# Patient Record
Sex: Female | Born: 1935 | Race: Black or African American | Hispanic: No | Marital: Married | Smoking: Never smoker
Health system: Southern US, Community
[De-identification: ages and names within clinical notes are randomized; demographics above are authoritative.]

## PROBLEM LIST (undated history)

## (undated) DIAGNOSIS — N189 Chronic kidney disease, unspecified: Secondary | ICD-10-CM

## (undated) DIAGNOSIS — K56609 Unspecified intestinal obstruction, unspecified as to partial versus complete obstruction: Secondary | ICD-10-CM

## (undated) DIAGNOSIS — C189 Malignant neoplasm of colon, unspecified: Secondary | ICD-10-CM

## (undated) DIAGNOSIS — I10 Essential (primary) hypertension: Secondary | ICD-10-CM

## (undated) DIAGNOSIS — K635 Polyp of colon: Secondary | ICD-10-CM

## (undated) HISTORY — DX: Unspecified intestinal obstruction, unspecified as to partial versus complete obstruction: K56.609

## (undated) HISTORY — DX: Chronic kidney disease, unspecified: N18.9

## (undated) HISTORY — DX: Polyp of colon: K63.5

---

## 1999-04-14 ENCOUNTER — Other Ambulatory Visit: Admission: RE | Admit: 1999-04-14 | Discharge: 1999-04-14 | Payer: Self-pay | Admitting: Family Medicine

## 1999-11-20 ENCOUNTER — Ambulatory Visit (HOSPITAL_COMMUNITY): Admission: RE | Admit: 1999-11-20 | Discharge: 1999-11-20 | Payer: Self-pay | Admitting: Family Medicine

## 1999-11-20 ENCOUNTER — Encounter: Payer: Self-pay | Admitting: Family Medicine

## 2001-01-11 ENCOUNTER — Encounter: Admission: RE | Admit: 2001-01-11 | Discharge: 2001-01-11 | Payer: Self-pay | Admitting: Family Medicine

## 2001-01-11 ENCOUNTER — Encounter: Payer: Self-pay | Admitting: Family Medicine

## 2002-01-11 ENCOUNTER — Encounter: Admission: RE | Admit: 2002-01-11 | Discharge: 2002-01-11 | Payer: Self-pay | Admitting: Family Medicine

## 2002-01-11 ENCOUNTER — Encounter: Payer: Self-pay | Admitting: Family Medicine

## 2002-08-03 ENCOUNTER — Other Ambulatory Visit: Admission: RE | Admit: 2002-08-03 | Discharge: 2002-08-03 | Payer: Self-pay | Admitting: Family Medicine

## 2003-05-19 DIAGNOSIS — C189 Malignant neoplasm of colon, unspecified: Secondary | ICD-10-CM

## 2003-05-19 HISTORY — DX: Malignant neoplasm of colon, unspecified: C18.9

## 2003-05-19 HISTORY — PX: COLON SURGERY: SHX602

## 2004-03-05 ENCOUNTER — Ambulatory Visit (HOSPITAL_COMMUNITY): Admission: RE | Admit: 2004-03-05 | Discharge: 2004-03-05 | Payer: Self-pay | Admitting: Gastroenterology

## 2004-03-05 ENCOUNTER — Encounter (INDEPENDENT_AMBULATORY_CARE_PROVIDER_SITE_OTHER): Payer: Self-pay | Admitting: *Deleted

## 2004-03-12 ENCOUNTER — Encounter (INDEPENDENT_AMBULATORY_CARE_PROVIDER_SITE_OTHER): Payer: Self-pay | Admitting: *Deleted

## 2004-03-12 ENCOUNTER — Inpatient Hospital Stay (HOSPITAL_COMMUNITY): Admission: RE | Admit: 2004-03-12 | Discharge: 2004-03-18 | Payer: Self-pay | Admitting: General Surgery

## 2004-03-12 HISTORY — PX: OTHER SURGICAL HISTORY: SHX169

## 2004-04-04 ENCOUNTER — Ambulatory Visit: Payer: Self-pay | Admitting: Oncology

## 2004-04-18 ENCOUNTER — Ambulatory Visit (HOSPITAL_BASED_OUTPATIENT_CLINIC_OR_DEPARTMENT_OTHER): Admission: RE | Admit: 2004-04-18 | Discharge: 2004-04-18 | Payer: Self-pay | Admitting: General Surgery

## 2004-04-18 ENCOUNTER — Ambulatory Visit (HOSPITAL_COMMUNITY): Admission: RE | Admit: 2004-04-18 | Discharge: 2004-04-18 | Payer: Self-pay | Admitting: General Surgery

## 2004-05-18 DIAGNOSIS — K56609 Unspecified intestinal obstruction, unspecified as to partial versus complete obstruction: Secondary | ICD-10-CM

## 2004-05-18 HISTORY — DX: Unspecified intestinal obstruction, unspecified as to partial versus complete obstruction: K56.609

## 2004-05-20 ENCOUNTER — Ambulatory Visit: Payer: Self-pay | Admitting: Oncology

## 2004-07-08 ENCOUNTER — Ambulatory Visit: Payer: Self-pay | Admitting: Oncology

## 2004-08-25 ENCOUNTER — Ambulatory Visit: Payer: Self-pay | Admitting: Oncology

## 2004-10-24 ENCOUNTER — Ambulatory Visit: Payer: Self-pay | Admitting: Oncology

## 2004-11-02 ENCOUNTER — Inpatient Hospital Stay (HOSPITAL_COMMUNITY): Admission: EM | Admit: 2004-11-02 | Discharge: 2004-11-14 | Payer: Self-pay | Admitting: Emergency Medicine

## 2004-11-03 ENCOUNTER — Ambulatory Visit: Payer: Self-pay | Admitting: Oncology

## 2004-12-18 ENCOUNTER — Ambulatory Visit: Payer: Self-pay | Admitting: Oncology

## 2004-12-20 IMAGING — CR DG CHEST 1V PORT
2 series · 2 of 2 positions shown · non-contrast
Comparison: none

CLINICAL DATA: 68-year-old ? colon cancer.  Pneumothorax. 
 PORTABLE CHEST SINGLE VIEW 04/18/04:
 Single portable view of the chest compared to the previous study from earlier today.  There are inspiration and expiration views.  The left sided port-a-cath is stable.  No definite pneumothorax is seen.  Persistent bibasilar atelectasis.  No edema or effusions.

[view not recorded (1 of 2)]
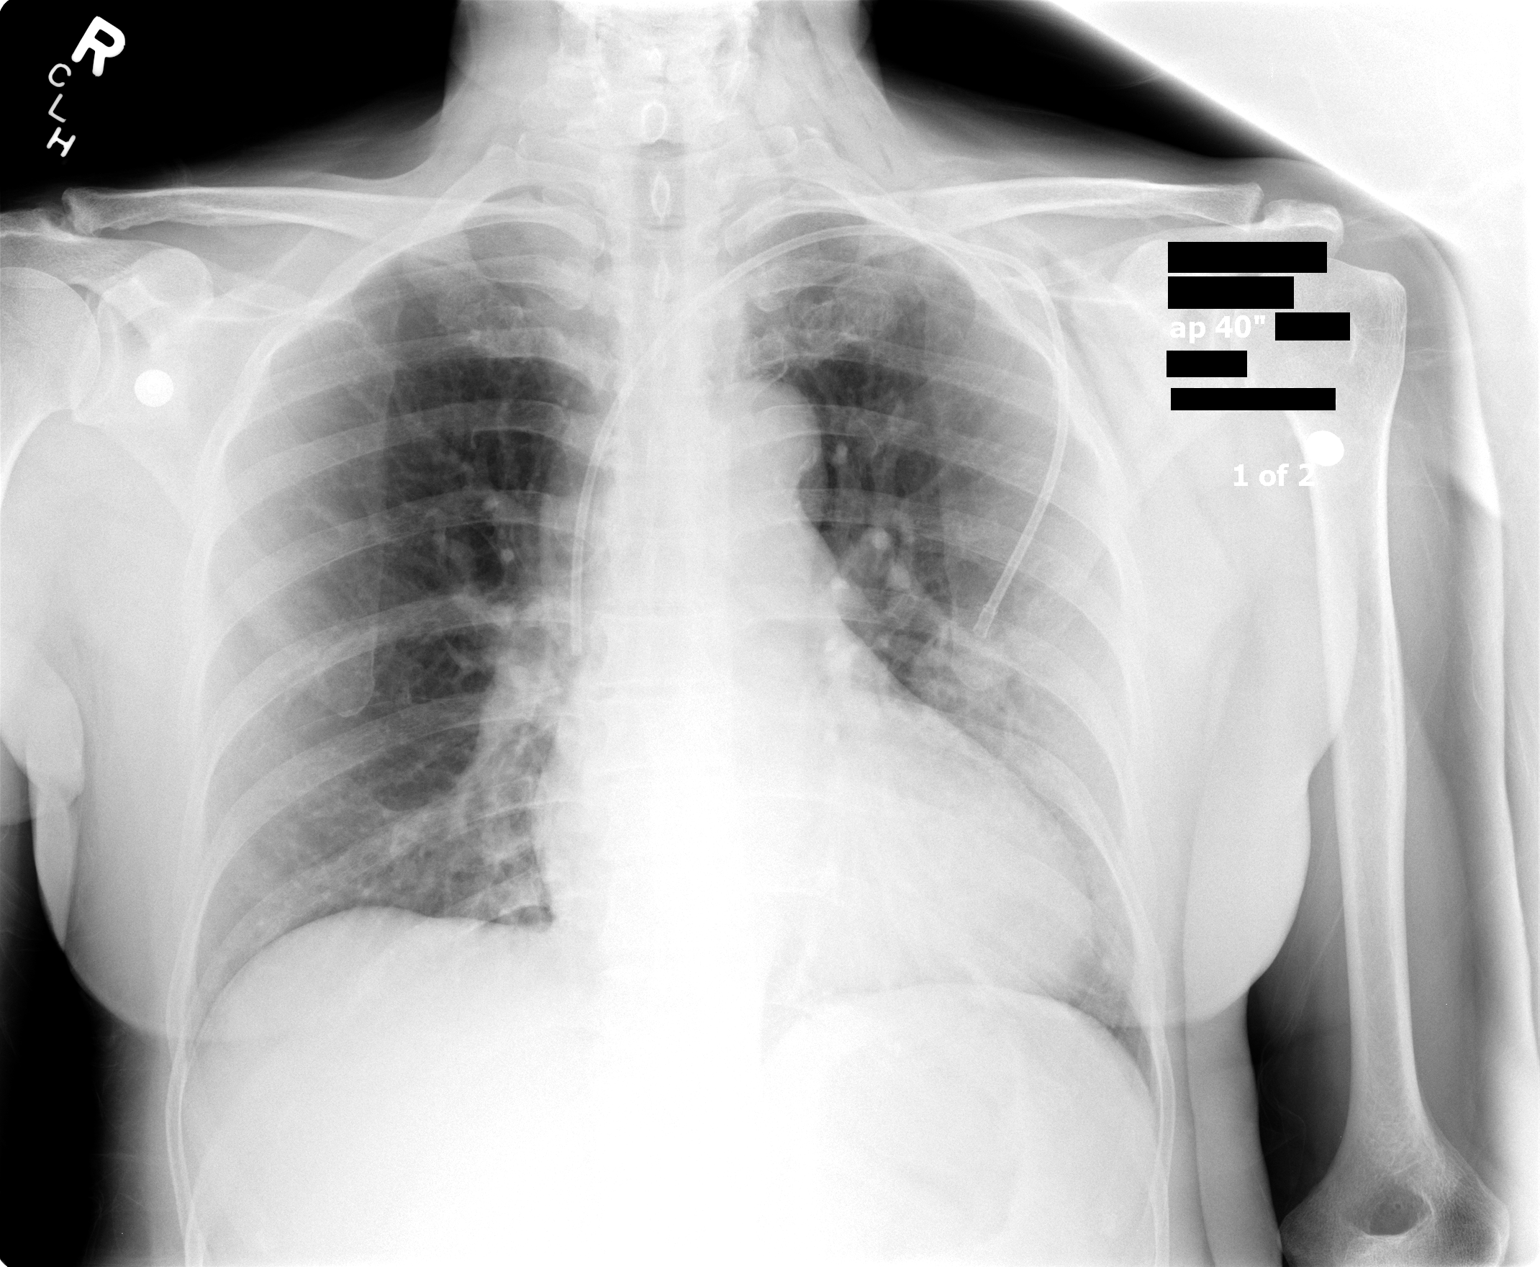

[view not recorded (2 of 2)]
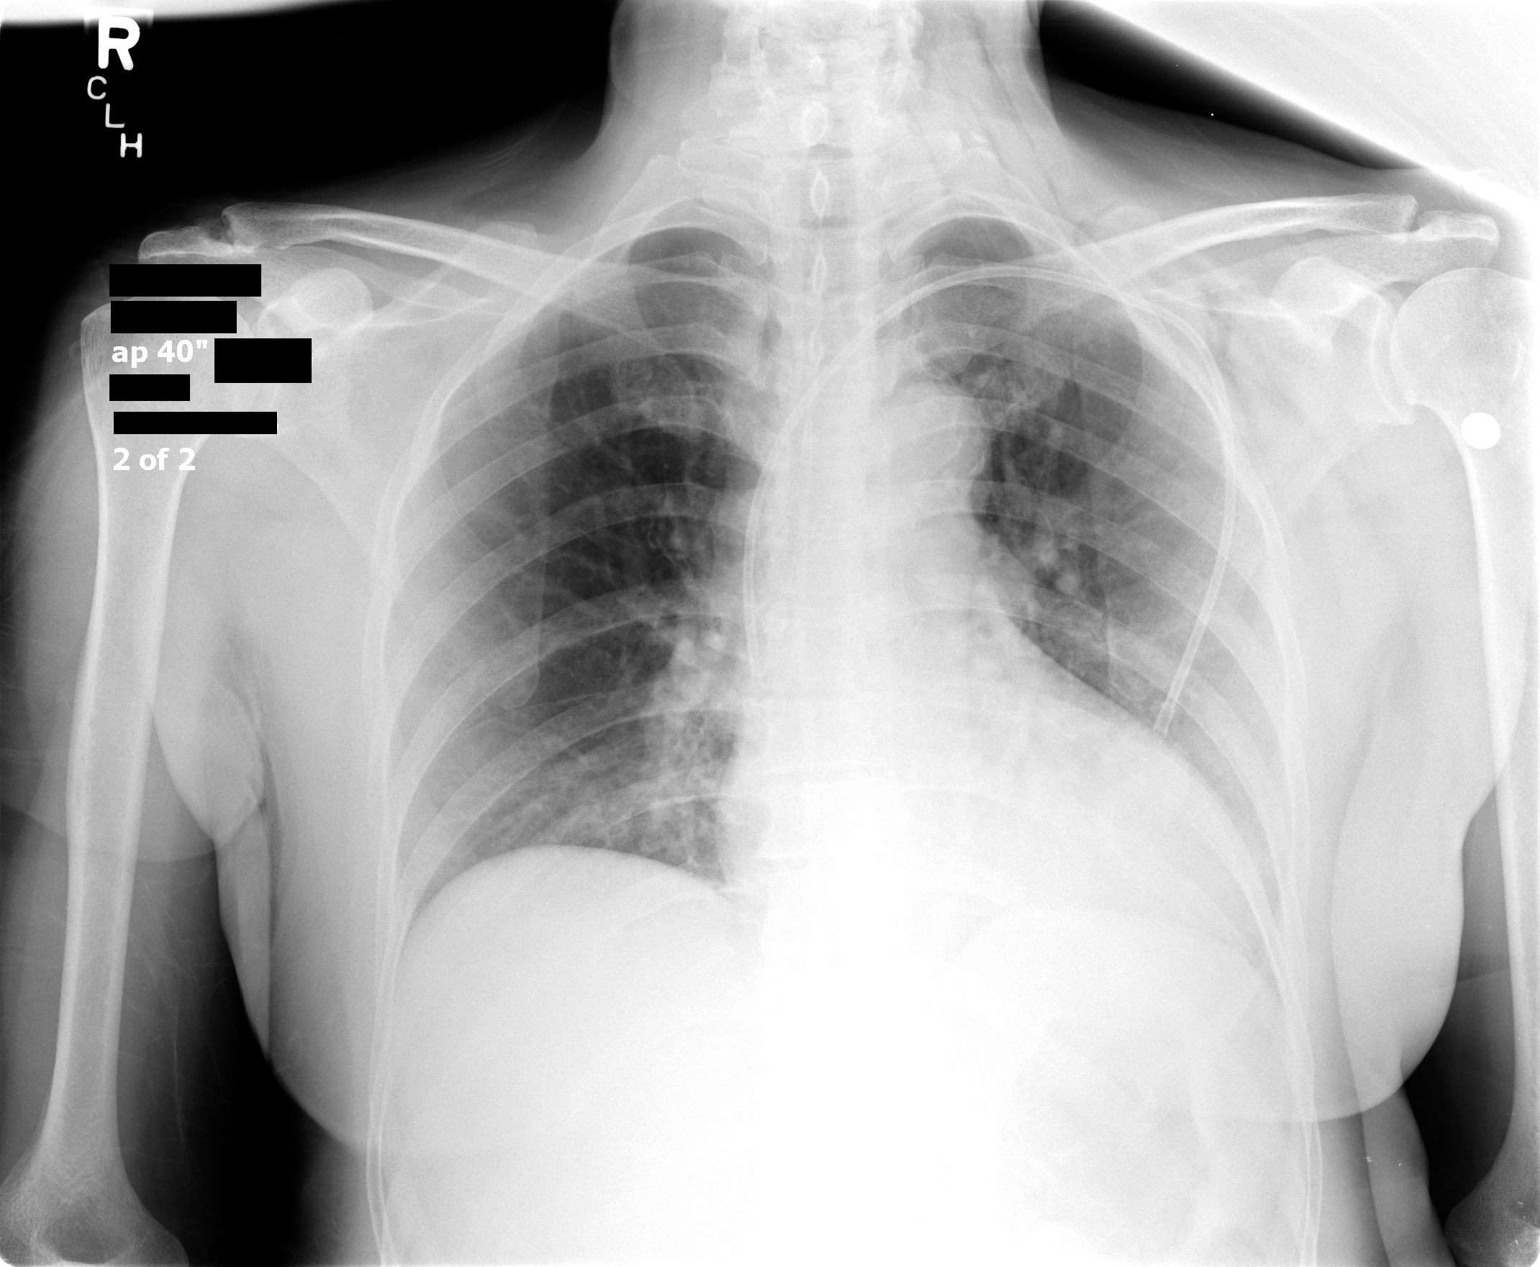

[2 of 2 positions shown; findings below may reference images not displayed]

IMPRESSION: 1.  Inspiration and expiration chest films demonstrating no definite left sided pneumothorax.  There is a stable small amount of subcutaneous emphysema in the left chest and neck.

## 2005-03-18 ENCOUNTER — Ambulatory Visit: Payer: Self-pay | Admitting: Oncology

## 2005-04-20 ENCOUNTER — Encounter (INDEPENDENT_AMBULATORY_CARE_PROVIDER_SITE_OTHER): Payer: Self-pay | Admitting: *Deleted

## 2005-04-20 ENCOUNTER — Ambulatory Visit (HOSPITAL_COMMUNITY): Admission: RE | Admit: 2005-04-20 | Discharge: 2005-04-20 | Payer: Self-pay | Admitting: Gastroenterology

## 2005-04-27 ENCOUNTER — Ambulatory Visit (HOSPITAL_COMMUNITY): Admission: RE | Admit: 2005-04-27 | Discharge: 2005-04-27 | Payer: Self-pay | Admitting: Oncology

## 2005-06-12 ENCOUNTER — Ambulatory Visit (HOSPITAL_BASED_OUTPATIENT_CLINIC_OR_DEPARTMENT_OTHER): Admission: RE | Admit: 2005-06-12 | Discharge: 2005-06-12 | Payer: Self-pay | Admitting: General Surgery

## 2005-10-02 ENCOUNTER — Ambulatory Visit: Payer: Self-pay | Admitting: Oncology

## 2005-10-06 ENCOUNTER — Ambulatory Visit (HOSPITAL_COMMUNITY): Admission: RE | Admit: 2005-10-06 | Discharge: 2005-10-06 | Payer: Self-pay | Admitting: Oncology

## 2005-10-06 LAB — COMPREHENSIVE METABOLIC PANEL
AST: 19 U/L (ref 0–37)
Alkaline Phosphatase: 94 U/L (ref 39–117)
Glucose, Bld: 148 mg/dL — ABNORMAL HIGH (ref 70–99)
Sodium: 139 mEq/L (ref 135–145)
Total Bilirubin: 0.6 mg/dL (ref 0.3–1.2)
Total Protein: 7.3 g/dL (ref 6.0–8.3)

## 2005-10-09 LAB — BASIC METABOLIC PANEL
BUN: 33 mg/dL — ABNORMAL HIGH (ref 6–23)
CO2: 29 mEq/L (ref 19–32)
Calcium: 9.8 mg/dL (ref 8.4–10.5)
Glucose, Bld: 104 mg/dL — ABNORMAL HIGH (ref 70–99)
Potassium: 4.3 mEq/L (ref 3.5–5.3)
Sodium: 137 mEq/L (ref 135–145)

## 2005-10-22 LAB — BASIC METABOLIC PANEL
BUN: 22 mg/dL (ref 6–23)
Creatinine, Ser: 1.29 mg/dL — ABNORMAL HIGH (ref 0.40–1.20)
Glucose, Bld: 102 mg/dL — ABNORMAL HIGH (ref 70–99)
Potassium: 5 mEq/L (ref 3.5–5.3)

## 2006-04-09 ENCOUNTER — Ambulatory Visit: Payer: Self-pay | Admitting: Oncology

## 2006-04-13 ENCOUNTER — Ambulatory Visit (HOSPITAL_COMMUNITY): Admission: RE | Admit: 2006-04-13 | Discharge: 2006-04-13 | Payer: Self-pay | Admitting: Oncology

## 2006-04-13 LAB — COMPREHENSIVE METABOLIC PANEL
ALT: 24 U/L (ref 0–35)
AST: 22 U/L (ref 0–37)
CO2: 30 mEq/L (ref 19–32)
Calcium: 10.4 mg/dL (ref 8.4–10.5)
Chloride: 108 mEq/L (ref 96–112)
Glucose, Bld: 88 mg/dL (ref 70–99)
Potassium: 4.4 mEq/L (ref 3.5–5.3)
Total Bilirubin: 0.4 mg/dL (ref 0.3–1.2)

## 2006-10-07 ENCOUNTER — Ambulatory Visit: Payer: Self-pay | Admitting: Oncology

## 2006-11-15 ENCOUNTER — Encounter: Admission: RE | Admit: 2006-11-15 | Discharge: 2006-11-15 | Payer: Self-pay | Admitting: Family Medicine

## 2006-11-15 ENCOUNTER — Other Ambulatory Visit: Admission: RE | Admit: 2006-11-15 | Discharge: 2006-11-15 | Payer: Self-pay | Admitting: Family Medicine

## 2006-12-15 IMAGING — CT CT ABDOMEN W/ CM
2 of 6 series · 17 of 46 positions shown, 19 images · non-contrast
Comparison: 10/06/2005

CLINICAL DATA: Colon cancer
TECHNIQUE: Multidetector CT imaging of the chest, abdomen and pelvis was
performed following the standard protocol during bolus administration of
intravenous contrast.

[Series 2: cap 5.0 b40f · axial · 0.66mm/px · z∈[-562,-38]mm · 14 of 119 slices shown, 16 images]
[im 7/119  soft-tissue]
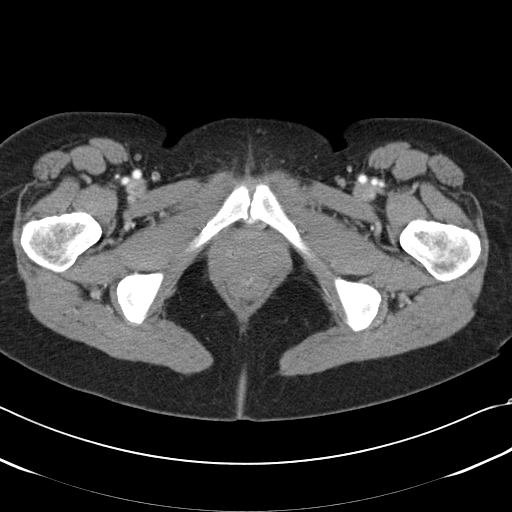
[im 7/119  bone]
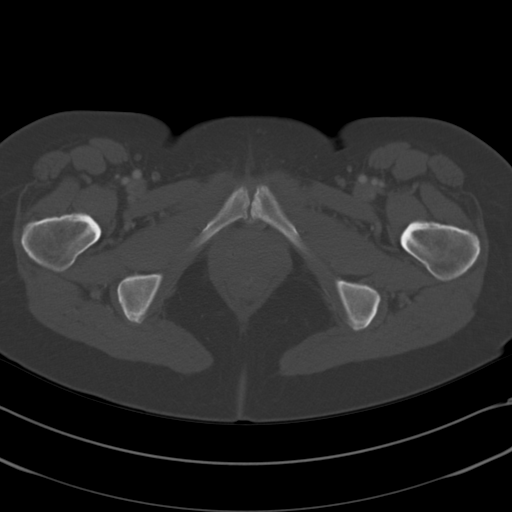
[im 14/119  soft-tissue]
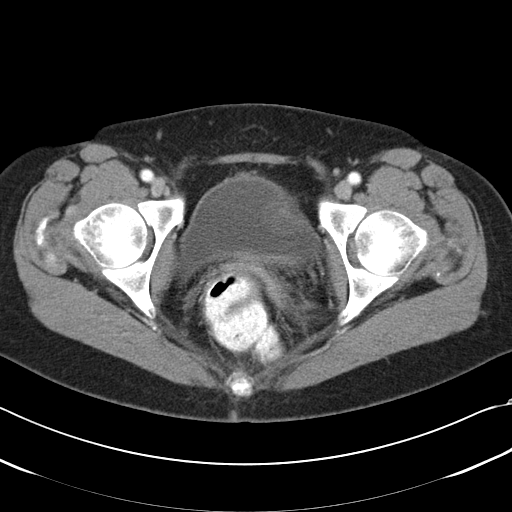
[im 21/119  soft-tissue]
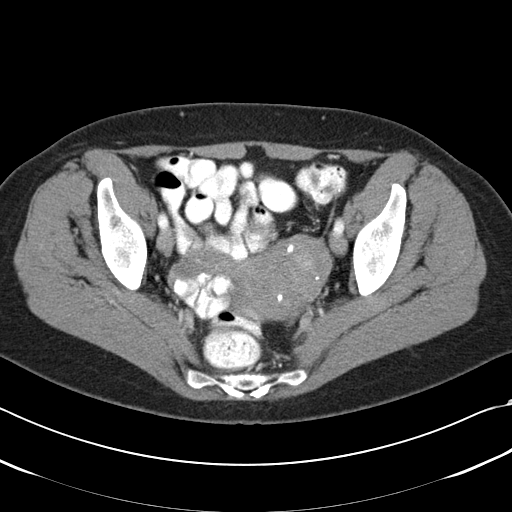
[im 35/119  soft-tissue]
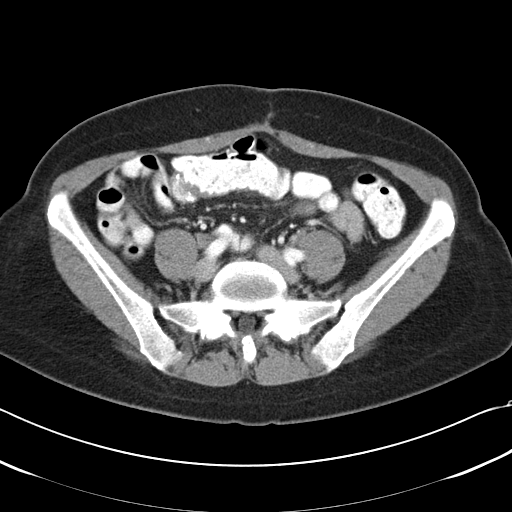
[im 42/119  soft-tissue]
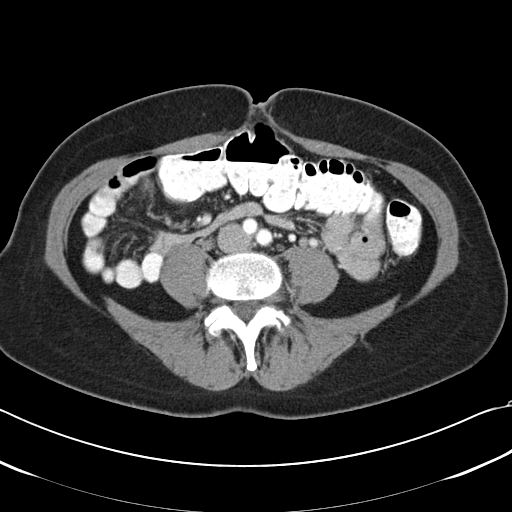
[im 49/119  soft-tissue]
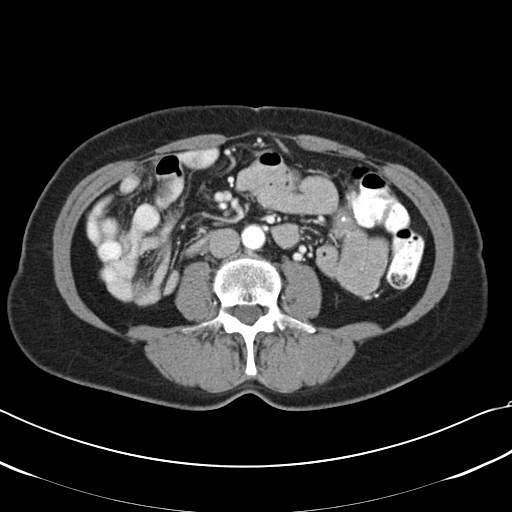
[im 56/119  soft-tissue]
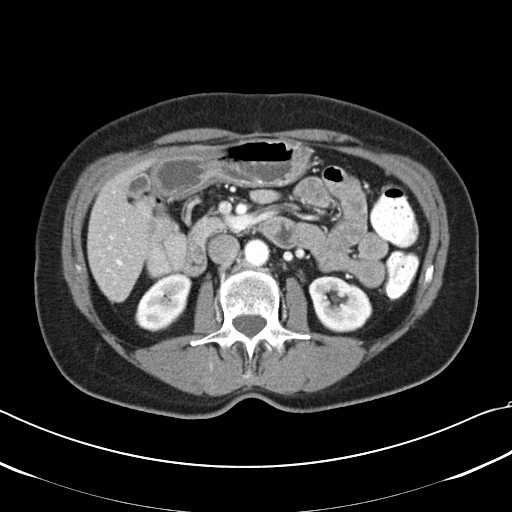
[im 63/119  soft-tissue]
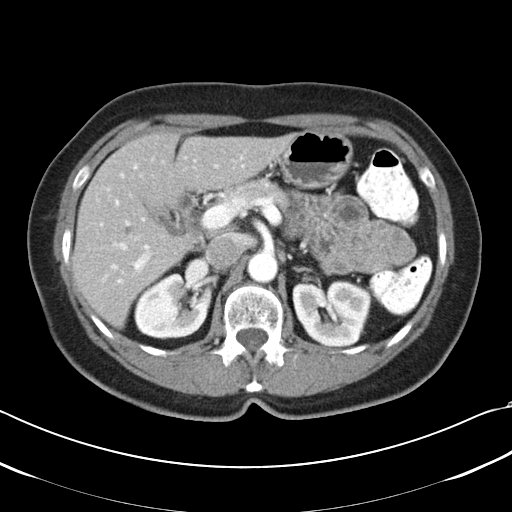
[im 70/119  soft-tissue]
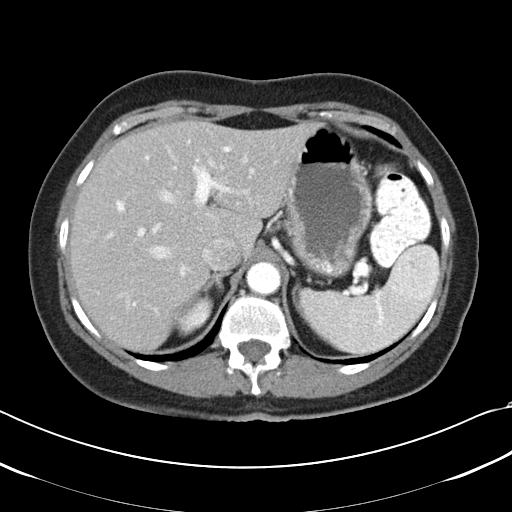
[im 70/119  bone]
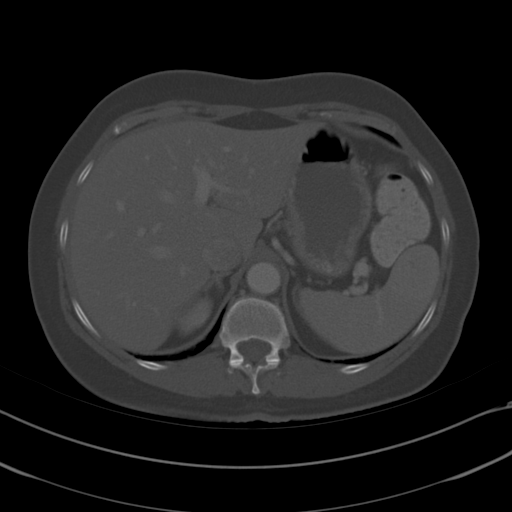
[im 77/119  soft-tissue]
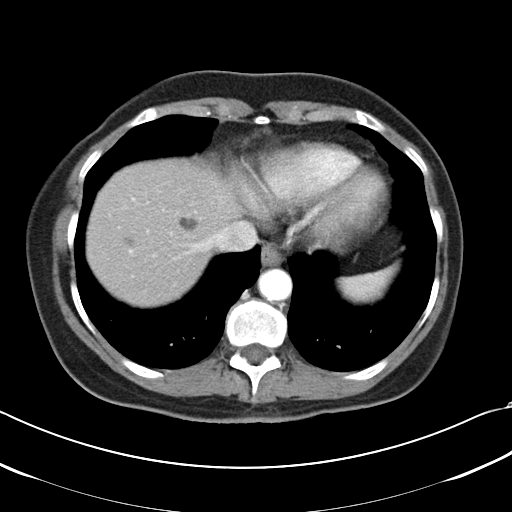
[im 91/119  soft-tissue]
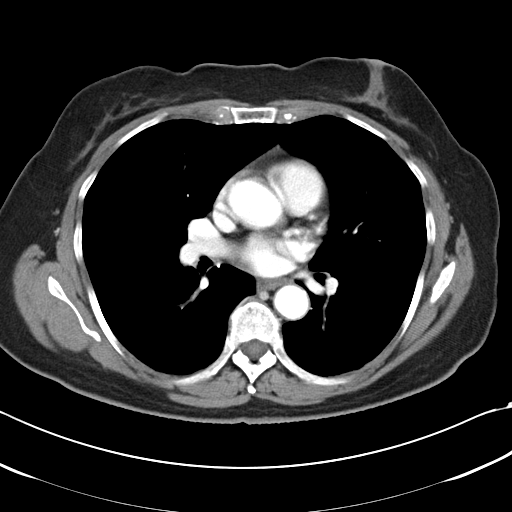
[im 98/119  soft-tissue]
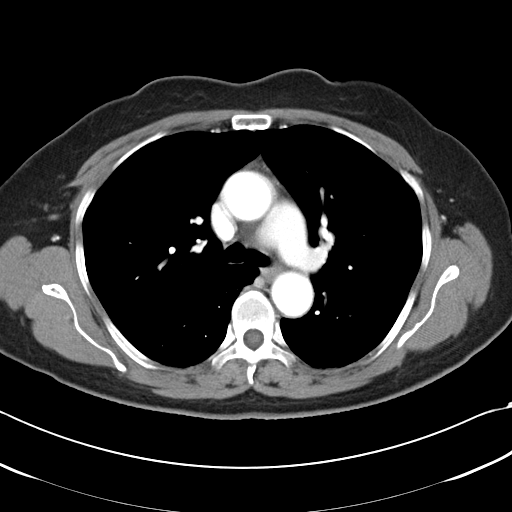
[im 105/119  soft-tissue]
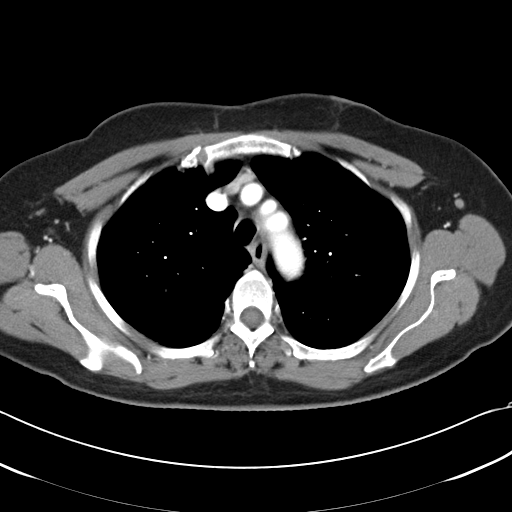
[im 112/119  soft-tissue]
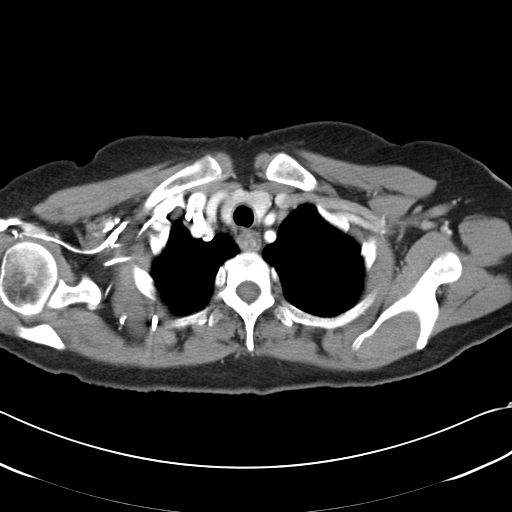

[Series 5: mpr coronal cap · coronal · 1.15mm/px · 3 of 63 slices shown]
[im 21/63  soft-tissue]
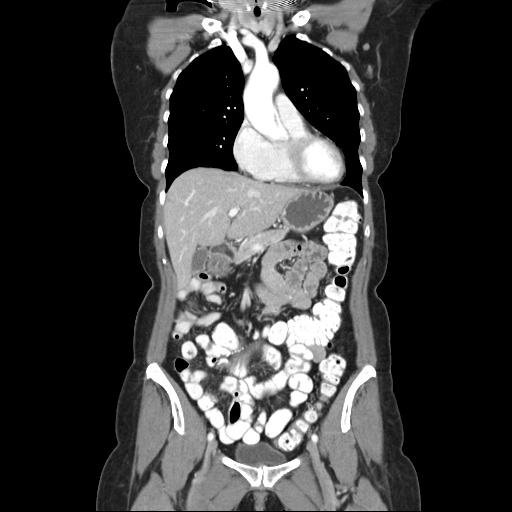
[im 28/63  soft-tissue]
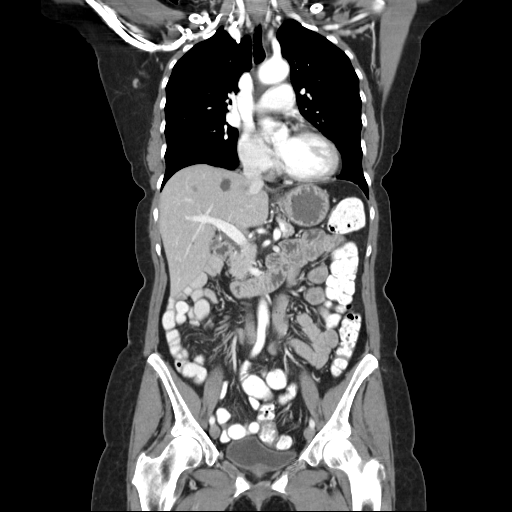
[im 35/63  soft-tissue]
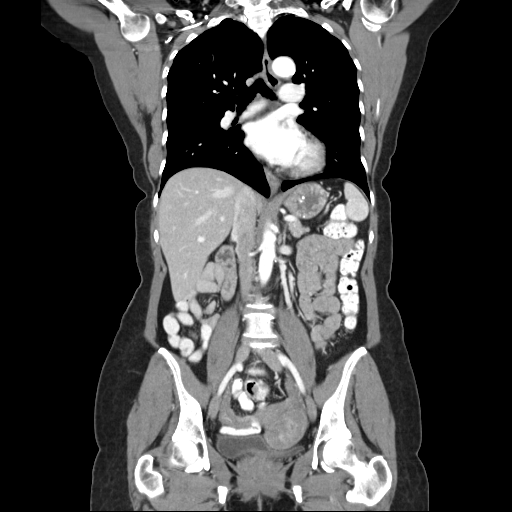

[17 of 46 positions shown; findings below may reference images not displayed]

Contrast:  125 cc Omnipaque 300

CHEST CT WITH CONTRAST:

Small bilateral axillary lymph nodes are unchanged. No mediastinal or hilar
adenopathy. Heart size is stable. No pericardial or pleural effusion. Lung
windows are stable. No new or enlarging parenchymal nodules or masses. Tiny
subpleural nodule in the right middle lobe is unchanged and probably represents
a subpleural lymph node.
IMPRESSION: Stable exam. No evidence for metastatic disease.

ABDOMEN CT WITH CONTRAST:

Several well-defined low density lesions are scattered throughout both lobes of
the liver, stable. Imaging features are compatible with hepatic cysts. The
spleen, stomach, duodenum, pancreas, gallbladder, adrenal glands, and kidneys
have normal imaging features. No evidence for intraperitoneal free fluid or
abdominal lymphadenopathy. Patient is status post a right hemicolectomy.
IMPRESSION: Stable exam. No evidence for metastatic involvement.

PELVIS CT WITH CONTRAST:

No pelvic lymphadenopathy. No intraperitoneal free fluid. There are no adnexal
masses and fibroid changes again identified in the uterus. Bladder is
nondistended.

Bone windows in the chest, abdomen, and pelvis are unremarkable.
IMPRESSION: Stable. No evidence for metastatic disease in the anatomic pelvis.

## 2007-03-31 ENCOUNTER — Ambulatory Visit: Payer: Self-pay | Admitting: Oncology

## 2007-04-05 ENCOUNTER — Ambulatory Visit (HOSPITAL_COMMUNITY): Admission: RE | Admit: 2007-04-05 | Discharge: 2007-04-05 | Payer: Self-pay | Admitting: Oncology

## 2007-04-05 LAB — BASIC METABOLIC PANEL
Chloride: 105 mEq/L (ref 96–112)
Creatinine, Ser: 1.36 mg/dL — ABNORMAL HIGH (ref 0.40–1.20)
Potassium: 4.8 mEq/L (ref 3.5–5.3)
Sodium: 143 mEq/L (ref 135–145)

## 2007-04-08 LAB — BASIC METABOLIC PANEL
BUN: 24 mg/dL — ABNORMAL HIGH (ref 6–23)
CO2: 28 mEq/L (ref 19–32)
CO2: 28 mEq/L (ref 19–32)
Calcium: 10.4 mg/dL (ref 8.4–10.5)
Chloride: 109 mEq/L (ref 96–112)
Glucose, Bld: 125 mg/dL — ABNORMAL HIGH (ref 70–99)
Glucose, Bld: 94 mg/dL (ref 70–99)
Potassium: 5.6 mEq/L — ABNORMAL HIGH (ref 3.5–5.3)
Sodium: 147 mEq/L — ABNORMAL HIGH (ref 135–145)
Sodium: 142 mEq/L (ref 135–145)

## 2007-08-30 ENCOUNTER — Ambulatory Visit (HOSPITAL_COMMUNITY): Admission: RE | Admit: 2007-08-30 | Discharge: 2007-08-30 | Payer: Self-pay | Admitting: Oncology

## 2007-10-04 ENCOUNTER — Ambulatory Visit: Payer: Self-pay | Admitting: Oncology

## 2007-12-05 ENCOUNTER — Encounter: Admission: RE | Admit: 2007-12-05 | Discharge: 2007-12-05 | Payer: Self-pay | Admitting: Family Medicine

## 2008-03-23 ENCOUNTER — Ambulatory Visit: Payer: Self-pay | Admitting: Oncology

## 2008-03-27 LAB — CEA: CEA: 1.5 ng/mL (ref 0.0–5.0)

## 2008-09-20 ENCOUNTER — Ambulatory Visit: Payer: Self-pay | Admitting: Oncology

## 2008-09-24 LAB — CEA: CEA: 1.8 ng/mL (ref 0.0–5.0)

## 2008-12-27 ENCOUNTER — Encounter: Admission: RE | Admit: 2008-12-27 | Discharge: 2008-12-27 | Payer: Self-pay | Admitting: Family Medicine

## 2009-03-22 ENCOUNTER — Ambulatory Visit: Payer: Self-pay | Admitting: Oncology

## 2009-12-30 ENCOUNTER — Encounter: Admission: RE | Admit: 2009-12-30 | Discharge: 2009-12-30 | Payer: Self-pay | Admitting: Family Medicine

## 2010-03-21 ENCOUNTER — Ambulatory Visit: Payer: Self-pay | Admitting: Oncology

## 2010-06-08 ENCOUNTER — Encounter: Payer: Self-pay | Admitting: Oncology

## 2010-10-03 NOTE — Op Note (Signed)
Gwendolyn Nelson, Gwendolyn Nelson              ACCOUNT NO.:  [id number]   MEDICAL RECORD NO.:  [id number]          PATIENT TYPE:  INP   LOCATION:  2899                         FACILITY:  [facility]   PHYSICIAN:  Jimmye Norman III, M.D.  DATE OF BIRTH:  [birth year]   DATE OF PROCEDURE:  03/12/2004  DATE OF DISCHARGE:                                 OPERATIVE REPORT   PREOPERATIVE DIAGNOSES:  Right colon cancer.   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   OPERATION PERFORMED:  Right hemicolectomy.   SURGEON:  Marta Lamas. Lindie Spruce, M.D.   ASSISTANT:  Ollen Gross. Carolynne Edouard, M.D.   ANESTHESIA:  General endotracheal.   ESTIMATED BLOOD LOSS:  Less than 100 mL.   COMPLICATIONS:  None.   CONDITION:  Good.   OPERATIVE FINDINGS:  The patient had a constricting lesion of the distal  hepatic flexure, proximal right transverse colon with no evidence of hepatic  metastasis.  The gallbladder was decompressed and had no stones.  There was  no evidence of widespread of the tumor although at the junction of the  antrum and the pylorus on the greater curvature, there was one hard what  appeared to be lymph node which was sent separate as a specimen.   DESCRIPTION OF PROCEDURE:  The patient was taken to the operating room and  placed on the table in the supine position.  After an adequate endotracheal  anesthetic was administered, the patient was prepped and draped in the usual  sterile manner exposing the midline of the abdomen.  A midline incision was  made using a #10 blade and taken down from above the umbilicus to below  using a #10 blade to the midline fascia.  We used electrocautery to enter  into the peritoneal cavity through the fascia and then extended the fascial  incision up to the length of the skin incision.   Upon palpation of the abdomen, we could palpate multiple what appeared to be  cystic lesions of the liver, one on the left lobe, one on  the right lobe but  none of which appeared to be metastatic.  This was confirmed by preoperative  CT scan.  The gallbladder was decompressed.  There were no stones in the  gallbladder.  Palpating the tumor, it was right just distal to the hepatic  flexure and just adjacent to it was a very firm lymph node which appeared to  be external to the mesentery of the right colon extending to the greater  curvature in the pylorus.   We placed the patient in slight Trendelenburg position and we took the right  colon down from the line [place] using electrocautery.  We mobilized it  medially and as we got up to the hepatic flexure, we used blunt and sharp  dissection with electrocautery to gradually pull the right colon away from  the duodenum.  As we did so, we ran into the vessels extending from the  hepatic flexure up towards the gallbladder fossa.  These were taken down  between [facility] clamps and 2-0 silk ties.  There was this one enlarged lymph  node which appeared to be adherent to the greater curvature at the pylorus  which we took down between [place] clamps, 2-0 ties and sent it separately as  a specimen.   We continued to take the right colon up to the midtransverse colon just to  the right of the middle colic vessel.  At point, we used a GIA-71 stapler to  transect the transverse colon.  At the terminal ileum, we came across that  about 3 to 5 cm distal to the ileocecal junction.  We came across it with  the GIA-75 stapler.  The mesentery in between was taken down between [place]  clamps and 2-0 silk ties.  The bowel remained viable.  We did a side-to-side  functional end-to-end anastomosis between the terminal ileum and the  transverse colon using the GIA-75 3.5 mm stapler.  The resulting enterotomy  there was closed using a TA-55 3.5 mm stapler.   The mesentery between the transverse colon and the small bowel was closed  off using figure-of-eight interrupted stitches of 2-0 silk.  This  provided  for easy flow and we traced it back from the anastomosis to the ligament of  [place] where there was found to be no evidence of any strictures kinks or  twists in the bowel.   We irrigated with saline after changing the surgeon's gloves and the  assistant's gloves.  We irrigated with about 2 L of saline.  Once this was  done, we closed the abdomen in a single layer of running #1 PDS suture.  The  skin was closed using stainless steel staples.  Sterile dressings was applied.  All sponge, needle and instrument counts  were correct.  The patient remained in the operating room for an epidural  catheter to be placed perioperatively.       JW/MEDQ  D:  03/12/2004  T:  03/12/2004  Job:  [id number]

## 2010-10-03 NOTE — Op Note (Signed)
NAMEAMBERLYNN, Gwendolyn Nelson              ACCOUNT NO.:  [id number]   MEDICAL RECORD NO.:  [id number]          PATIENT TYPE:  INP   LOCATION:  1302                         FACILITY:  [facility]   PHYSICIAN:  Lorre Munroe., M.D.DATE OF BIRTH:  1934/08/29   DATE OF PROCEDURE:  11/07/2004  DATE OF DISCHARGE:                                 OPERATIVE REPORT   POSTOPERATIVE DIAGNOSIS:  Small-bowel obstruction due to adhesions.   POSTOPERATIVE DIAGNOSIS:  Small-bowel obstruction due to adhesions.   OPERATION:  Lysis of adhesions.   SURGEON:  Lebron Conners, M.D.   ANESTHESIA:  General.   DESCRIPTION OF PROCEDURE:  After the patient was monitored and anesthetized  and had a Foley catheter and routine preparation and draping of the abdomen.  I made a short lower midline incision utilizing a portion of the previous  incision and extending it slightly downward.  I very carefully cut into the  peritoneal cavity and found there was just a little bit of adhesion of  omentum to the abdominal anterior abdominal wall, and I took that down.  There is a fair amount of clear ascites present. I evacuated that.  A  portion of the small bowel was markedly distended and a portion of it was  nondistended.  I took down a few adhesions of the bowel to the abdominal  wall and then was able to eviscerate it nicely and I found an area where the  small intestine was adherent to the anastomosis and had a band of tissue  compressing it and completely blocking it.  After I cut that, the bowel  appeared to be free of obstruction.  I milked some of the intestinal content  back up into the stomach and it was drawn out through the nasogastric tube,  the later bladder good part of it to remain in the intestine and it went  through to the distal bowel very nicely.  I examined the peritoneal surfaces  on the liver and retroperitoneum and found no evidence of any metastatic  tumor.  I then inspected all the areas which had been  dissected made sure of  good hemostasis.  The sponge, needle and instrument counts were correct.  I  replaced the abdominal contents into the abdomen and laid the omentum over  it  and then closed the wound with a running #1 PDS, beginning the suture at  each end and tying simple knot in the middle.  I closed the skin with  staples after irrigating subcutaneous tissues.  The patient was stable  throughout.  Blood loss was minimal.       WB/MEDQ  D:  11/07/2004  T:  11/07/2004  Job:  [id number]   cc:   Leighton Roach. Truett Perna, M.D.  [address]  [place]  [place] [address]  Fax: [phone]

## 2010-10-03 NOTE — Discharge Summary (Signed)
[name], Gwendolyn Nelson              ACCOUNT NO.:  [id number]   MEDICAL RECORD NO.:  [id number]          PATIENT TYPE:  INP   LOCATION:  5710                         FACILITY:  [facility]   PHYSICIAN:  Jimmye Norman III, M.D.  DATE OF BIRTH:  22-Dec-1934   DATE OF ADMISSION:  03/12/2004  DATE OF DISCHARGE:  03/18/2004                                 DISCHARGE SUMMARY   DISCHARGE DIAGNOSIS:  Hepatic flexure adenocarcinoma with lymphadenopathy.   DISCHARGE MEDICATIONS:  She was discharged home with:  1.  Vicodin to take as needed as for pain.  2.  Also, she was given ferrous sulfate and multivitamins and Reglan.   Her condition was good. She was on a soft diet at discharge.  She had a  followup appointment to see Dr. Lindie Spruce on the following Thursday for staple  removal, and she also had oncology followup.   BRIEF SUMMARY OF HOSPITAL COURSE:  The patient was admitted the day of  surgery which was the 26th and had at-home bowel prep for what was known to  be a constricting lesion of her hepatic flexure.  She was found to have a  large lesion of her hepatic flexure pathologically which turned out to be  moderately differentiated adenocarcinoma, 4.3 cm in size invading through  the muscularis propria and with 2 of 27 lymph nodes positive.  She did well,  went home on postoperative day number 6 after oncology consultation on  postoperative day #3.  She was doing quite well at that time, tolerating a  diet well, passing gas.  The wound was healing well with no evidence of  infection.  Bowel movements she had had.  She was discharged to home in care  of her family.  Her follow up was to see me, staple removal in a few days,  and then in two weeks after that.       JW/MEDQ  D:  05/02/2004  T:  05/03/2004  Job:  [id number]

## 2010-10-03 NOTE — H&P (Signed)
Gwendolyn Nelson, Gwendolyn Nelson              ACCOUNT NO.:  [id number]   MEDICAL RECORD NO.:  [id number]          PATIENT TYPE:  INP   LOCATION:  5707                         FACILITY:  [facility]   PHYSICIAN:  Jimmye Norman III, M.D.  DATE OF BIRTH:  1934-08-20   DATE OF ADMISSION:  03/12/2004  DATE OF DISCHARGE:                                HISTORY & PHYSICAL   IDENTIFICATION AND CHIEF COMPLAINT:  The patient is a 76 year old female  with some recently diagnosed adenocarcinoma of the distal right colon who  now is being admitted for a right hemicolectomy.   HISTORY OF PRESENT ILLNESS:  I was asked by Dr. Loreta Ave to see Gwendolyn Nelson in  the endoscopy suite after she had undergone a colonoscopy for colorectal  screening with a family history of colon cancer.   Her history is one that is fairly unremarkable.  She has had no previous  operations, but does have a family history of colon cancer with a maternal  uncle and a paternal grandmother who had colon cancer.  Other significant  history is of diabetes and coronary artery disease.   She reports no hematochezia, no bright red blood per rectum.   REVIEW OF SYSTEMS:  She does have occasional dizziness.   She is being admitted today, October 26, for a right hemicolectomy.   PAST MEDICAL HISTORY:  1.  Hypertension.  2.  Hypercholesterolemia.   PAST SURGICAL HISTORY:  None.   MEDICATIONS:  1.  Lisinopril/hydrochlorothiazide combination 20/12.5 once daily.  2.  Lipitor 10 mg daily.   ALLERGIES:  She has no known drug allergies.   SOCIAL HISTORY:  The patient is married with two children.  She exercises  regularly once to three times a week.  She denies any illegal or illicit  drug use.  Does not drink any alcohol.  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   REVIEW OF SYSTEMS:  Mentioned previously.  She has seasonal allergies,  occasional dizziness, hyperlipidemia.   PHYSICAL  EXAMINATION:  GENERAL:  I saw the patient in the endoscopy suite  where she was still somewhat sleepy from her previous colonoscopy.  No  abdominal pain.  VITAL SIGNS:  Stable.  She was afebrile.  HEENT:  Normocephalic, atraumatic.  NECK:  She had no cervical adenopathy.  LUNGS:  Clear.  CARDIAC:  Unremarkable.  NEUROLOGIC:  Cranial nerves II-XII were grossly intact.  ABDOMEN:  Distended from the gaseous distention from the colonoscopy, but no  tenderness.  Spleen and liver were nonpalpable below the costal margins.  RECTAL:  Not performed.   Chest x-ray which was done preoperatively showed no evidence of metastasis  with mild cardiomegaly.  She has had no history of any previous failure.  Colonoscopy results are as mentioned before with a large circumferential  mass in the distal right colon probably near the hepatic flexure.  A CT scan  was done which demonstrated that she had some cysts deep in her liver and  also probably some near the surface.  If they are accessible at  surgery,  these will be biopsied.  Pathology report demonstrates adenocarcinoma.  CT  scan report confirms what we mentioned before.  Laboratory studies including  a CEA are pending, but have been drawn.   IMPRESSION:  Right colon cancer.   PLAN:  Right hemicolectomy.  We discussed with the patient risks and  benefits and she wishes to proceed.  She will be admitted day of surgery  after getting an outpatient two-day bowel prep.       JW/MEDQ  D:  03/12/2004  T:  03/12/2004  Job:  [id number]   cc:   Anselmo Rod, M.D.  [address]  [address]  [place]  [place] [address]  Fax: [phone]

## 2010-10-03 NOTE — Op Note (Signed)
[name], Gwendolyn Nelson              ACCOUNT NO.:  [id number]   MEDICAL RECORD NO.:  [id number]          PATIENT TYPE:  AMB   LOCATION:  ENDO                         FACILITY:  [facility]   PHYSICIAN:  Anselmo Rod, M.D.  DATE OF BIRTH:  1935-02-16   DATE OF PROCEDURE:  03/05/2004  DATE OF DISCHARGE:                                 OPERATIVE REPORT   PROCEDURE PERFORMED:  Colonoscopy with cold biopsies times six.   ENDOSCOPIST:  Charna Elizabeth, M.D.   INSTRUMENT USED:  Olympus video colonoscope.   INDICATIONS FOR PROCEDURE:  The patient is a 76 year old African-American  female with a family history of colon cancer undergoing screening  colonoscopy.  Rule out colonic polyps, masses, etc.   PREPROCEDURE PREPARATION:  Informed consent was procured from the patient.  The patient was fasted for eight hours prior to the procedure and prepped  with a bottle of magnesium citrate and a gallon of GoLYTELY the night prior  to the procedure.   PREPROCEDURE PHYSICAL:  The patient had stable vital signs.  Neck supple.  Chest clear to auscultation.  S1 and S2 regular.  Abdomen soft with normal  bowel sounds.   DESCRIPTION OF PROCEDURE:  The patient was placed in left lateral decubitus  position and sedated with 75 mg of Demerol and 5 mg of Versed in slow  incremental doses.  Once the patient was adequately sedated and maintained  on low flow oxygen and continuous cardiac monitoring, the Olympus video  colonoscope was advanced from the rectum to the cecum.  A large  circumferential mass was seen in the distal right colon.  Multiple cold  biopsies were done to establish the diagnosis.  The appendicular orifice and  ileocecal valve were clearly visualized and appeared normal.  The transverse  colon and the left colon appeared normal.  Retroflexion in the rectum  revealed no abnormalities.  The patient tolerated the procedure well without  complications.   IMPRESSION:  1.  Large circumferential mass  noted in distal right colon, consistent with      a malignancy.  Multiple biopsies done.  No other masses or polyps      identified.  2.  No evidence of diverticulosis.   RECOMMENDATIONS:  1.  Await pathology results.  2.  Check CBC, BMET, hepatic function panel, PT/PTT and a CEA today.  3.  CT scan of abdomen and pelvis to be done today.  4.  Surgical evaluation with Jimmye Norman, M.D. at [facility]      at the earliest.       JNM/MEDQ  D:  03/05/2004  T:  03/05/2004  Job:  [id number]   cc:   Renaye Rakers, M.D.  [phone] [address]  [place]  [place] [address]  Fax: [phone]   Jimmye Norman III, M.D.  [phone] [address]  [place]  [place] [address]  Fax: [phone]

## 2010-10-03 NOTE — Discharge Summary (Signed)
Gwendolyn Nelson, Gwendolyn Nelson              ACCOUNT NO.:  [id number]   MEDICAL RECORD NO.:  [id number]          PATIENT TYPE:  INP   LOCATION:  1302                         [facility]   PHYSICIAN:  Lorre Munroe., M.D.DATE OF BIRTH:  1934/06/22   DATE OF ADMISSION:  11/02/2004  DATE OF DISCHARGE:  11/14/2004                                 DISCHARGE SUMMARY   HISTORY:  The patient is a 76 year old female who has had a right colectomy  carcinoma. It was a large locally advanced tumor with lymph node metastases  but no carcinomatosis. She had received adjuvant chemotherapy. She was  followed by Dr. Cyndie Chime. He admitted the patient to the hospital for  vomiting and bloating. See his history and physical of November 02, 2004, for  greater details.   HOSPITAL COURSE:  The patient was admitted by Dr. Cyndie Chime, NG suction  started and she was given supportive care. X-rays showed a picture of small  intestinal obstruction. I was consulted to see the patient on November 05, 2004.  I advised further NG suction. CT scan was obtained which had confirmed small  bowel obstruction. The patient did not make progress toward resolution of  small bowel obstruction and I advised her to undergo operation. On November 07, 2004 I took her to the operating room and found that she had small bowel  obstruction due to adhesions. No evidence of carcinomatosis was found. She  generally did well after the operation with some steady return of  gastrointestinal function. By November 12, 2004 she was ready to have a liquid  diet and that was started and she made rapid progress thereafter. Wound  healed primarily without infection. By November 14, 2004 she was ready to go  home and was discharged. Arrangements were made for follow up with me in 2-3  weeks and continued follow-up by Dr. Cyndie Chime.   DIAGNOSIS:  1.  Intestinal obstruction due to adhesions  2.  Hypertension.  3.  History of cancer of the colon.   OPERATION:   Lysis of adhesions.   DISCHARGE CONDITION:  Improved       WB/MEDQ  D:  12/14/2004  T:  12/15/2004  Job:  [id number]   cc:   Genene Churn. Cyndie Chime, M.D.  [address] [facility]  [place]  [place] [address]  Fax: [phone]   Anselmo Rod, M.D.  [address]  [address]  [place]  [place] [address]  Fax: [phone]   Renaye Rakers, M.D.  [phone]  [place]  [place] [address]  Fax: [phone]

## 2010-10-03 NOTE — Op Note (Signed)
NAMEFELIX, PRATT              ACCOUNT NO.:  [id number]   MEDICAL RECORD NO.:  [id number]          PATIENT TYPE:  AMB   LOCATION:  DSC                          FACILITY:  [facility]   PHYSICIAN:  Jimmye Norman III, M.D.  DATE OF BIRTH:  [birth year]   DATE OF PROCEDURE:  04/18/2004  DATE OF DISCHARGE:                                 OPERATIVE REPORT   PREOPERATIVE DIAGNOSIS:  Colon cancer.   POSTOPERATIVE DIAGNOSIS:  Colon cancer.   PROCEDURE:  Placement of left subclavian Port-A-Cath.   SURGEON:  Jimmye Norman, M.D.   ANESTHESIA:  Monitored anesthesia care with 1% Xylocaine local.   ESTIMATED BLOOD LOSS:  Less than 20 mL.   COMPLICATIONS:  None.   CONDITION:  Good.  On fluoroscopy, placement is excellent, excellent blood  return, no air on insertion.   INDICATIONS FOR PROCEDURE:  The patient is a pleasant 76 year old female  with a recently diagnosed metastatic colon cancer who comes in now for [facility]-  A-Cath placement for chemotherapy.   OPERATION:  The patient was taken to the operating room and placed on the  table in supine position.  After an adequate amount of IV sedation was  placed, she had a roll of towels placed between her scapula and she was  placed in Trendelenburg position and she was prepped and draped in the usual  sterile manner exposing the left subclavian area, although we were planning  to go to the right side if left side cannulation was not possible.   We anesthetized the lateral third subclavicular area with 1% Xylocaine with  epinephrine.  We used a 14 gauge access needle to access the subclavian  vein.  The initial puncture was a single puncture, however, we could not  pass the wire adequately.  We had to access it a second time in order to  gain access to the vein via the Seldinger technique with the wire.  Initial  passage of the wire would run into some type of obstructive process,  possibly a valve, which snagged the catheter and did not allow [place] to  feed it  adequately.  However, our last pass it went down into the SVC area and right  atrial area and we were able to confirm this with fluoroscopy.  We formed a  pocket on the anterior abdominal wall just above the left breast after  anesthetizing the area.  We soaked the pocket with antibiotic solution and a  gauze.  We made a tunnel between the port site and the subclavian insertion  site using a tunneler with the attached catheter.  We had flushed the  catheter with heparinized saline solution.  We then passed the catheter deep  into the venous system using an introducer and a dilator.  This was  confirmed with fluoroscopy.  The catheter was very deep and we brought it  back under fluoroscopy to the right atrial superior vena caval junction.  Once it was adequately placed, we cut it to the appropriate length and  attached it to the pre-flushed port with this locking cap.  We secured that  into  the pocket using 3-0 Vicryl sutures which had been placed prior to the  attachment to the catheter.  We flushed and had adequate return through the  [facility] needle.  We were able to flush it easily.  We then secured it in the  pocket and closed the pocket after irrigating with antibiotic solution.  We  closed the pocket with interrupted 4-0 Vicryl sutures and  then the skin was  closed with running subcuticular stitch of 5-0 Vicryl.  Once all were closed, we did fluoroscopy again which showed it to be in  excellent position.  We applied Steri-Strips to the wound and applied  sterile dressing.  All needle counts, sponge counts, and instrument counts  were correct at the conclusion of the case.       JW/MEDQ  D:  04/18/2004  T:  04/18/2004  Job:  [id number]

## 2010-10-03 NOTE — Op Note (Signed)
NAMEMARITHZA, MALACHI              ACCOUNT NO.:  [id number]   MEDICAL RECORD NO.:  [id number]          PATIENT TYPE:  AMB   LOCATION:  ENDO                         FACILITY:  [facility]   PHYSICIAN:  Anselmo Rod, M.D.  DATE OF BIRTH:  [birth year]   DATE OF PROCEDURE:  04/20/2005  DATE OF DISCHARGE:                                 OPERATIVE REPORT   PROCEDURE:  Colonoscopy with cold biopsies x 2.   ENDOSCOPIST:  Charna Elizabeth, M.D.   INSTRUMENT USED:  Olympus video colonoscope.   INDICATIONS FOR PROCEDURE:  76 year old African American female undergoing  repeat colonoscopy after a right hemicolectomy for colon cancer to rule out  colonic polyps, masses, etc.   PREPROCEDURE PREPARATION:  Informed consent was obtained from the patient.  The patient was fasted for four hours prior to the procedure and prepped  with Osmo prep the night of and the morning of the procedure.  The risks and  benefits of the procedure including a 10% miss rate of cancer and polyps  were discussed with the patient, as well.   PREPROCEDURE PHYSICAL:  Patient with stable vital signs.  Neck supple.  Chest clear to auscultation.  S1 and S2 regular.  Abdomen soft with normal  bowel sounds.   DESCRIPTION OF PROCEDURE:  The patient was placed in the left lateral  decubitus position, sedated with 50 mcg Fentanyl and 4 mg Versed in slow  incremental doses.  Once the patient was adequately sedated, maintained on  low flow oxygen and continuous cardiac monitoring, the Olympus video  colonoscope was advanced from the rectum to the anastomosis at 90 cm without  difficulty.  There was some stool in the colon, multiple washes were done.  The distal small bowel appeared normal, a few early sigmoid diverticula were  seen.  Two small sessile polyps were removed by core biopsies from 30 cm.  Small internal hemorrhoids were seen on retroflexion.  The patient tolerated  the procedure well without immediate complications.   IMPRESSION:  1.Small non-bleeding internal hemorrhoids.  2.Early sigmoid diverticula noted.  3.Two small sessile polyps biopsied from 30 cm (core biopsies x 2).  4.Healthy anastomosis at 90 cm.  5.Normal distal small bowel.   RECOMMENDATIONS:  1.Await pathology results.  2.Avoid non-steroidals including aspirin for the next two weeks.  3.Repeat colonoscopy in the next two years or earlier if need be.  4.Outpatient follow up as need arises in the future.      Anselmo Rod, M.D.  Electronically Signed     JNM/MEDQ  D:  04/20/2005  T:  04/20/2005  Job:  [id number]   cc:   Renaye Rakers, M.D.  Fax: [phone]   Cherylynn Ridges, M.D.  [address]  [place]  [place] [address]   Leighton Roach. Truett Perna, M.D.  Fax: [phone]

## 2010-10-03 NOTE — Consult Note (Signed)
Gwendolyn Nelson, Gwendolyn Nelson              ACCOUNT NO.:  [id number]   MEDICAL RECORD NO.:  [id number]          PATIENT TYPE:  INP   LOCATION:  1302                         FACILITY:  [facility]   PHYSICIAN:  Lorre Munroe., M.D.DATE OF BIRTH:  01-22-1935   DATE OF CONSULTATION:  11/05/2004  DATE OF DISCHARGE:                                   CONSULTATION   CHIEF COMPLAINT:  Abdominal pain and vomiting.   HISTORY OF PRESENT ILLNESS:  The patient is a 76 year old black female who  had a right colectomy for carcinoma in October of last year. It was a large  locally advanced tumor with lymph node metastases and one fairly distant  lymph node metastasis. There was no carcinomatosis at the time. The patient  had generally done well and had received adjuvant chemotherapy. She has had  several days of increase in abdominal pain, vomiting and obstipation.  Dr.  Cyndie Chime put her in the hospital three days ago and she has been on bowel  rest, but the patient still feels bloated and is vomiting occasionally,  remains obstipated and has a x-rays showing small bowel obstruction. Dr.  Truett Perna called me today and asked me to see her to evaluate and give advice  on treatment of this. She has not had fever or  chills, had not had any  diarrhea prior to this.   PAST MEDICAL HISTORY:  She has high blood pressure. She has not had any  abdominal surgery other than the colectomy. She has a port in place. She  denies heart disease, lung disease and other serious chronic problems.   MEDICATIONS:  Prinivil 20 mg daily and hydrochlorothiazide 12.5 mg daily. No  known medical allergies.   FAMILY HISTORY AND CHILDHOOD ILLNESSES:  Unremarkable.   SOCIAL HISTORY:  She does not smoke or drink.   REVIEW OF SYSTEMS:  No shortness of breath, no chest pain, no chronic GI  symptoms. She denies urinary difficulties and denies other concerning  symptoms.   PHYSICAL EXAM:  VITAL SIGNS:  Temperature and vital signs as  noted by the  nurse. GENERAL:  Patient is in no acute distress. She says she feels weak  and ill.  HEENT:  The head, neck, eyes, ears, nose, mouth, throat unremarkable. No  supraclavicular adenopathy or mass.  CHEST:  Clear to auscultation.  HEART:  Rate and rhythm normal, no murmur or gallop.  ABDOMEN:  Moderately distended, soft with very slight tenderness. A few  bowel sounds heard, no tinkles or rushes sounds were not very active. In the  groins good pulses. No enlarged lymph nodes. No hernias, a well-healed  midline incision on the abdomen.  EXTREMITIES:  No edema. Good pulses.  NEUROLOGICAL:  Grossly intact.   IMPRESSION:  Small bowel obstruction due to adhesions or carcinomatosis.   PLAN:  She needs nasogastric suction and I have ordered it. I think will get  a CT scan to see if we can differentiate cause of her obstruction. Talked to  her and her husband, realize that this may eventuate in necessity for  laparotomy. I will follow her closely with  you.       WB/MEDQ  D:  11/05/2004  T:  11/05/2004  Job:  [id number]   cc:   Leighton Roach. Truett Perna, M.D.  [address]  [place]  [place] [address]  Fax: [phone]

## 2010-10-03 NOTE — Op Note (Signed)
[name], Gwendolyn Nelson              ACCOUNT NO.:  [id number]   MEDICAL RECORD NO.:  [id number]          PATIENT TYPE:  AMB   LOCATION:  DSC                          FACILITY:  [facility]   PHYSICIAN:  Cherylynn Ridges, M.D.    DATE OF BIRTH:  1935/02/05   DATE OF PROCEDURE:  06/12/2005  DATE OF DISCHARGE:                                 OPERATIVE REPORT   PREOPERATIVE DIAGNOSIS:  Colon cancer with metastasis.   POSTOPERATIVE DIAGNOSIS:  Colon cancer with metastasis, with functional Port-  A-Cath in place.   PROCEDURE:  Removal of a left subclavian Port-A-Cath.   SURGEON:  Cherylynn Ridges, M.D.   It was done under local anesthesia with 2% Xylocaine with epinephrine.   ESTIMATED BLOOD LOSS:  Less than 5 mL.   No complications.  The port was removed intact.   INDICATIONS FOR OPERATION:  The patient is a 76 year old who underwent  chemotherapy for metastatic colon cancer, who now comes in for MediPort  removal.   DESCRIPTION OF PROCEDURE:  The procedure was done in the minor room with a  Betadine prep.  We used a 28-gauge needle to anesthetize the area at the  incision line for the reservoir with 2% Xylocaine with epinephrine.  We made  an incision with a #15 blade and then subsequently bluntly dissected out the  catheter at the connection with the reservoir.  We removed it from the  distal portion of the vein with minimal difficulty and then dissected out  the reservoir from its pocket with minimal difficulty.  Once this was done,  we irrigated with saline or with the Xylocaine solution and then closed the  skin using running subcuticular stitch of 5-0 Vicryl.  A sterile dressing  was applied including Steri-Strips.      Cherylynn Ridges, M.D.  Electronically Signed     JOW/MEDQ  D:  06/12/2005  T:  06/12/2005  Job:  [id number]

## 2010-10-03 NOTE — H&P (Signed)
NAMETIFANNY, Gwendolyn Nelson              ACCOUNT NO.:  [id number]   MEDICAL RECORD NO.:  [id number]          PATIENT TYPE:  INP   LOCATION:  0102                         FACILITY:  [facility]   PHYSICIAN:  Genene Churn. Granfortuna, M.D.DATE OF BIRTH:  [birth year]   DATE OF ADMISSION:  11/02/2004  DATE OF DISCHARGE:                                HISTORY & PHYSICAL   CHIEF COMPLAINT:  Acute onset of abdominal pain and vomiting.   HISTORY OF PRESENT ILLNESS:  The patient is a 76 year old woman diagnosed  with stage III moderately differentiated adenocarcinoma of the colon in  November 2005.  She had a 4.3 cm lesion penetrating through the bowel wall.  There was associated vascular and lymphatic invasion.  Two of 27 lymph  glands were positive for cancer.  Of note, a lymph node taken from the  greater pyloric curvature was positive.   She underwent a right hemicolectomy by Dr. Jimmye Norman on March 12, 2004.  She had a constricting lesion of the distal hepatic flexure.  Palpation of  the liver revealed cystic lesions which were not felt to be metastatic  involvement.  A CT scan of the abdomen also read as these lesions being  consistent with cysts.   She just completed a program of adjuvant chemotherapy.  She had eight cycles  of Folfox.  She was then switched to oral Xeloda for two cycles due to  progressive neuropathy from the Folfox regimen.  She completed all therapy  on October 21, 2004.   She now presents with the acute onset of abdominal pain, crampy-type  abdominal pain awakening her from sleep at 1 o'clock this morning.  There  was associated vomiting.  She did have a bowel movement during the night,  but no diarrhea or melena.  Pain has persisted and she called for further  evaluation.   PAST MEDICAL HISTORY:  No other medical or surgical illness except for  hypertension.  No other surgical procedures other than placement of a Port-A-  Cath device for chemotherapy and her colon cancer  surgery.  No history of  MI, asthma, emphysema, ulcers, hepatitis, yellow jaundice, kidney stones,  thyroid trouble.   CURRENT MEDICATIONS:  Prinivil 20 mg with hydrochlorothiazide 12.5 mg daily.   ALLERGIES:  No known drug allergies.   FAMILY HISTORY:  No malignant disease.  She has one sister and one 1/2  sister.   SOCIAL HISTORY:  Married, two healthy sons, no alcohol or tobacco.   REVIEW OF SYSTEMS:  Negative, except HPI.  She does have persistent  neuropathy in her hands and feet with both burning and numbness.  She is  having difficulty undoing her buttons at present.   PHYSICAL EXAMINATION:  GENERAL:  A well-nourished African-American female.  VITAL SIGNS:  Blood pressure is 188/89, pulse 105 and regular, respirations  22, temperature 98.4.  SKIN, HAIR, NAILS:  Normal.  HEENT:  Pharynx:  No exudate or erythema.  NECK:  Supple.  LUNGS:  Clear and resonant to percussion.  HEART:  Regular cardiac rhythm, no murmur or gallop.  ABDOMEN:  Soft.  Currently nontender after  a dose of Dilaudid.  No palpable  mass or organomegaly.  Bowel sounds are present, but decreased.  No high  pitched sounds.  No peritoneal signs.  RECTAL:  No tenderness, no impaction.  There is no stool in the rectal vault  for guaiac testing.  EXTREMITIES:  No edema, no calf tenderness.  NEUROLOGIC:  Mental status intact.  Cranial nerves intact.  Motor strength  5/5.  Reflexes 1+ and symmetric.   LABORATORY DATA:  Hemoglobin 11.8, white count 6700, neutrophils 85%.  Platelets 218,000.  BUN 25, creatinine 1.5.  SGOT 63, SGPT 96, bilirubin  1.3, alkaline phosphatase 91, amylase 128.   Abdominal x-ray is pending.   IMPRESSION:  Probable early small bowel obstruction secondary to adhesions  from colon cancer surgery.  She does have mild liver function abnormalities.  However, I think metastatic disease would be unlikely at this point.   PLAN:  Admit for hydration, anti-emetics, bowel rest.  NG tube if  persistent  vomiting.  Check abdominal x-rays.  When otherwise stable and after  hydration, CT scan of the abdomen.       JMG/MEDQ  D:  11/02/2004  T:  11/02/2004  Job:  [id number]   cc:   Cherylynn Ridges, M.D.  [address], [address]  [place]  [place] [address]   Renaye Rakers, M.D.  [phone] [address]  [place]  [place] [address]  Fax: [phone]   Anselmo Rod, M.D.  [address]  [place]  [place] [address]  Fax: [phone]

## 2010-12-09 ENCOUNTER — Other Ambulatory Visit: Payer: Self-pay | Admitting: Family Medicine

## 2010-12-09 DIAGNOSIS — Z1231 Encounter for screening mammogram for malignant neoplasm of breast: Secondary | ICD-10-CM

## 2010-12-13 ENCOUNTER — Emergency Department (HOSPITAL_COMMUNITY)
Admission: EM | Admit: 2010-12-13 | Discharge: 2010-12-13 | Disposition: A | Discharge status: Home / Self Care | Payer: Medicare Other | Attending: Emergency Medicine | Admitting: Emergency Medicine

## 2010-12-13 ENCOUNTER — Emergency Department (HOSPITAL_COMMUNITY): Payer: Medicare Other

## 2010-12-13 ENCOUNTER — Encounter (HOSPITAL_COMMUNITY): Payer: Self-pay | Admitting: Radiology

## 2010-12-13 DIAGNOSIS — R300 Dysuria: Secondary | ICD-10-CM | POA: Insufficient documentation

## 2010-12-13 DIAGNOSIS — R002 Palpitations: Secondary | ICD-10-CM | POA: Insufficient documentation

## 2010-12-13 DIAGNOSIS — Z85038 Personal history of other malignant neoplasm of large intestine: Secondary | ICD-10-CM | POA: Insufficient documentation

## 2010-12-13 DIAGNOSIS — Z79899 Other long term (current) drug therapy: Secondary | ICD-10-CM | POA: Insufficient documentation

## 2010-12-13 DIAGNOSIS — E785 Hyperlipidemia, unspecified: Secondary | ICD-10-CM | POA: Insufficient documentation

## 2010-12-13 DIAGNOSIS — F411 Generalized anxiety disorder: Secondary | ICD-10-CM | POA: Insufficient documentation

## 2010-12-13 DIAGNOSIS — I10 Essential (primary) hypertension: Secondary | ICD-10-CM | POA: Insufficient documentation

## 2010-12-13 DIAGNOSIS — R000 Tachycardia, unspecified: Secondary | ICD-10-CM | POA: Insufficient documentation

## 2010-12-13 DIAGNOSIS — R799 Abnormal finding of blood chemistry, unspecified: Secondary | ICD-10-CM | POA: Insufficient documentation

## 2010-12-13 DIAGNOSIS — R110 Nausea: Secondary | ICD-10-CM | POA: Insufficient documentation

## 2010-12-13 HISTORY — DX: Malignant neoplasm of colon, unspecified: C18.9

## 2010-12-13 HISTORY — DX: Essential (primary) hypertension: I10

## 2010-12-13 LAB — CBC
HCT: 36 % (ref 36.0–46.0)
Hemoglobin: 12.3 g/dL (ref 12.0–15.0)
MCHC: 34.2 g/dL (ref 30.0–36.0)
Platelets: 251 10*3/uL (ref 150–400)
RDW: 12.5 % (ref 11.5–15.5)

## 2010-12-13 LAB — DIFFERENTIAL
Basophils Absolute: 0 10*3/uL (ref 0.0–0.1)
Basophils Relative: 0 % (ref 0–1)
Lymphs Abs: 1.5 10*3/uL (ref 0.7–4.0)
Monocytes Relative: 10 % (ref 3–12)

## 2010-12-13 LAB — D-DIMER, QUANTITATIVE: D-Dimer, Quant: 1.99 ug/mL-FEU — ABNORMAL HIGH (ref 0.00–0.48)

## 2010-12-13 LAB — COMPREHENSIVE METABOLIC PANEL
AST: 16 U/L (ref 0–37)
BUN: 32 mg/dL — ABNORMAL HIGH (ref 6–23)
Calcium: 10.8 mg/dL — ABNORMAL HIGH (ref 8.4–10.5)
Chloride: 102 mEq/L (ref 96–112)
GFR calc non Af Amer: 34 mL/min — ABNORMAL LOW (ref >60)
Glucose, Bld: 92 mg/dL (ref 70–99)
Potassium: 3.7 mEq/L (ref 3.5–5.1)
Total Protein: 7.3 g/dL (ref 6.0–8.3)

## 2010-12-13 LAB — TROPONIN I: Troponin I: <0.3 ng/mL (ref <0.30)

## 2010-12-13 MED ORDER — IOHEXOL 300 MG/ML  SOLN
80.0000 mL | Freq: Once | INTRAMUSCULAR | Status: AC | PRN
  Administered 2010-12-13: 80 mL via INTRAVENOUS

## 2010-12-18 LAB — T4, FREE: Free T4: 1.58 ng/dL (ref 0.80–1.70)

## 2011-01-09 ENCOUNTER — Ambulatory Visit
Admission: RE | Admit: 2011-01-09 | Discharge: 2011-01-09 | Disposition: A | Discharge status: Home / Self Care | Payer: Medicare Other | Source: Ambulatory Visit | Attending: Family Medicine | Admitting: Family Medicine

## 2011-01-09 DIAGNOSIS — Z1231 Encounter for screening mammogram for malignant neoplasm of breast: Secondary | ICD-10-CM

## 2011-03-02 ENCOUNTER — Encounter: Payer: Self-pay | Admitting: *Deleted

## 2011-04-29 ENCOUNTER — Other Ambulatory Visit: Payer: Self-pay | Admitting: *Deleted

## 2011-04-29 DIAGNOSIS — C189 Malignant neoplasm of colon, unspecified: Secondary | ICD-10-CM

## 2011-04-30 ENCOUNTER — Other Ambulatory Visit (HOSPITAL_BASED_OUTPATIENT_CLINIC_OR_DEPARTMENT_OTHER): Payer: Medicare Other | Admitting: Lab

## 2011-04-30 ENCOUNTER — Ambulatory Visit (HOSPITAL_BASED_OUTPATIENT_CLINIC_OR_DEPARTMENT_OTHER): Payer: Medicare Other | Admitting: Oncology

## 2011-04-30 VITALS — BP 146/75 | HR 75 | Temp 97.1°F | Ht 64.0 in | Wt 139.3 lb

## 2011-04-30 DIAGNOSIS — G620 Drug-induced polyneuropathy: Secondary | ICD-10-CM

## 2011-04-30 DIAGNOSIS — C189 Malignant neoplasm of colon, unspecified: Secondary | ICD-10-CM

## 2011-04-30 DIAGNOSIS — Z85038 Personal history of other malignant neoplasm of large intestine: Secondary | ICD-10-CM

## 2011-05-01 DIAGNOSIS — C189 Malignant neoplasm of colon, unspecified: Secondary | ICD-10-CM | POA: Insufficient documentation

## 2011-05-01 NOTE — Progress Notes (Signed)
OFFICE PROGRESS NOTE   INTERVAL HISTORY:   She returns as scheduled. She feels well. She has no complaint. She continues to have mild neuropathy symptoms in the feet. This does not interfere with activity.  Objective:  Vital signs in last 24 hours:  Blood pressure 146/75, pulse 75, temperature 97.1 F (36.2 C), temperature source Oral, height 5\' 4"  (1.626 m), weight 139 lb 4.8 oz (63.186 kg).    HEENT: Neck without mass Lymphatics: No cervical, supraclavicular, axillary, or inguinal lymph nodes Resp: Lungs clear bilaterally Cardio: Regular rate and rhythm GI: No hepatosplenomegaly. No mass. Vascular: No leg edem      Lab Results: CEA-2.1  Medications: I have reviewed the patient's current medications.  Assessment/Plan: 1.        Locally advanced colon cancer diagnosed in October 2005.  She remains in clinical remission.  A re-staging CT August 30, 2007, showed no  evidence for metastatic disease.   2. Small bowel obstruction secondary to adhesions in June 2006. 3. 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 4. History of colon polyps - she continues colonoscopy followup by Dr Loreta Ave. 5. History of renal insufficiency - followed by Dr Parke Simmers. 6. Hypertension - followed by Dr Parke Simmers. 7. Small lesion in the spleen on a CT April 05, 2007 - not seen on a CT August 30, 2007.   Disposition:  She remains in clinical remission from colon cancer. She will return for an office visit and CEA in one year. She continues colonoscopy followup with Dr. Loreta Ave. She last had a colonoscopy in September of 2011.   Lucile Shutters, MD  05/01/2011  9:11 AM

## 2011-05-06 ENCOUNTER — Telehealth: Payer: Self-pay | Admitting: *Deleted

## 2011-05-06 NOTE — Telephone Encounter (Signed)
Message copied by Wandalee Ferdinand on Wed May 06, 2011 11:10 AM ------      Message from: Ladene Artist      Created: Sat May 02, 2011  6:19 PM       Please call patient, CEA is normal

## 2011-05-06 NOTE — Telephone Encounter (Signed)
Left VM that CEA was normal. Follow up as scheduled. 

## 2011-06-23 ENCOUNTER — Ambulatory Visit: Payer: Medicare Other | Admitting: Oncology

## 2011-06-23 ENCOUNTER — Other Ambulatory Visit: Payer: Medicare Other | Admitting: Lab

## 2011-08-01 ENCOUNTER — Encounter (HOSPITAL_COMMUNITY): Payer: Self-pay | Admitting: Emergency Medicine

## 2011-08-01 ENCOUNTER — Emergency Department (INDEPENDENT_AMBULATORY_CARE_PROVIDER_SITE_OTHER)
Admission: EM | Admit: 2011-08-01 | Discharge: 2011-08-01 | Disposition: A | Discharge status: Home / Self Care | Payer: Medicare Other | Source: Home / Self Care | Attending: Emergency Medicine | Admitting: Emergency Medicine

## 2011-08-01 DIAGNOSIS — F411 Generalized anxiety disorder: Secondary | ICD-10-CM | POA: Diagnosis not present

## 2011-08-01 MED ORDER — ALPRAZOLAM 0.5 MG PO TABS: 0.5000 mg | ORAL_TABLET | Freq: Three times a day (TID) | ORAL | Status: AC | PRN

## 2011-08-01 MED ORDER — ALPRAZOLAM 0.5 MG PO TABS: 0.5000 mg | ORAL_TABLET | Freq: Three times a day (TID) | ORAL | Status: DC | PRN

## 2011-08-01 NOTE — ED Provider Notes (Signed)
History     CSN: [id number]  Arrival date & time 08/01/11  1424   First MD Initiated Contact with Patient 08/01/11 1426      Chief Complaint  Patient presents with  . Anxiety    (Consider location/radiation/quality/duration/timing/severity/associated sxs/prior treatment) HPI Comments: For about a week had been having trouble falling sleep and when I do a wakeup but 2- 3 AM. Unable to rest at all about her a week ago we receive troublesome use and that followed by the death of a person that when you very closely have been waking up frequently in the middle of the night unable to go back to sleep, not eating well feeling very nervous and shaky.  Left ear had similar episodes certain pills at home and with my nerves in a very well will followup in my doctors this can see her this week like it did last time  Patient is a 77 y.o. female presenting with anxiety. The history is provided by the patient and the spouse.  Anxiety This is a new problem. The current episode started more than 1 week ago. The problem occurs constantly. The problem has been gradually worsening. Pertinent negatives include no abdominal pain and no shortness of breath. She has tried nothing for the symptoms.    Past Medical History  Diagnosis Date  . Hypertension   . Colon cancer 2005    T3, N1, M1  . Colon polyps   . Chronic kidney disease     Renal insufficiency  . Small bowel obstruction 2006    due to adhesions    Past Surgical History  Procedure Date  . Colon surgery 2005    Right hemicolectomy  . Colon cancer 03/12/2004    History reviewed. No pertinent family history.  History  Substance Use Topics  . Smoking status: Never Smoker   . Smokeless tobacco: Not on file  . Alcohol Use: No    OB History    Grav Para Term Preterm Abortions TAB SAB Ect Mult Living                  Review of Systems  Constitutional: Negative for fever, diaphoresis, activity change and fatigue.  Respiratory:  Negative for chest tightness and shortness of breath.   Gastrointestinal: Negative for abdominal pain.  Psychiatric/Behavioral: Positive for sleep disturbance. Negative for suicidal ideas, hallucinations, self-injury and decreased concentration. The patient is nervous/anxious.     Allergies  Review of patient's allergies indicates no known allergies.  Home Medications   Current Outpatient Rx  Name Route Sig Dispense Refill  . ALPRAZOLAM 0.5 MG PO TABS Oral Take 1 tablet (0.5 mg total) by mouth 3 (three) times daily as needed for anxiety. 30 tablet 0  . AMLODIPINE BESYLATE 5 MG PO TABS Oral Take 5 mg by mouth daily.      . ATORVASTATIN CALCIUM 10 MG PO TABS Oral Take 10 mg by mouth daily.      Marland Kitchen OLMESARTAN MEDOXOMIL-HCTZ 40-25 MG PO TABS Oral Take 1 tablet by mouth daily.        BP 147/81  Pulse 90  Temp(Src) 98.2 F (36.8 C) (Oral)  Resp 18  SpO2 100%  Physical Exam  Nursing note and vitals reviewed. Constitutional: She appears well-developed and well-nourished. No distress.  Psychiatric: Her speech is normal and behavior is normal. Judgment and thought content normal. Her mood appears anxious. Her affect is not angry. She is not slowed and not withdrawn. Cognition and memory are normal.  She does not exhibit a depressed mood. She is attentive.    ED Course  Procedures (including critical care time)  Labs Reviewed - No data to display No results found.   1. Anxiety reaction       MDM  Anxiety reaction triggered by multiple stressors including a recent lawsuit and a loss of a (GAD score of 21 at urgent care)        Jimmie Molly, MD 08/01/11 1814

## 2011-08-01 NOTE — Discharge Instructions (Signed)

## 2011-08-01 NOTE — ED Notes (Signed)
Give blankets to patient and spouse in room

## 2011-08-01 NOTE — ED Notes (Signed)
Provided "anxiety" questionnaire

## 2011-08-01 NOTE — ED Notes (Signed)
Reports anxiety for one week.  Received troublesome news, this followed by a death of someone patient is fond of.  Since then has been unable to sleep.  Awakens frequently during the night, unable to go back to sleep, poor appetite, but is forcing self to eat.

## 2011-08-28 DIAGNOSIS — E119 Type 2 diabetes mellitus without complications: Secondary | ICD-10-CM | POA: Diagnosis not present

## 2011-08-28 DIAGNOSIS — I10 Essential (primary) hypertension: Secondary | ICD-10-CM | POA: Diagnosis not present

## 2011-08-28 DIAGNOSIS — R0789 Other chest pain: Secondary | ICD-10-CM | POA: Diagnosis not present

## 2011-09-11 DIAGNOSIS — I8390 Asymptomatic varicose veins of unspecified lower extremity: Secondary | ICD-10-CM | POA: Diagnosis not present

## 2011-09-11 DIAGNOSIS — E7800 Pure hypercholesterolemia, unspecified: Secondary | ICD-10-CM | POA: Diagnosis not present

## 2011-10-02 DIAGNOSIS — H524 Presbyopia: Secondary | ICD-10-CM | POA: Diagnosis not present

## 2011-10-02 DIAGNOSIS — H52229 Regular astigmatism, unspecified eye: Secondary | ICD-10-CM | POA: Diagnosis not present

## 2011-10-02 DIAGNOSIS — H2510 Age-related nuclear cataract, unspecified eye: Secondary | ICD-10-CM | POA: Diagnosis not present

## 2011-10-02 DIAGNOSIS — H5200 Hypermetropia, unspecified eye: Secondary | ICD-10-CM | POA: Diagnosis not present

## 2011-12-25 ENCOUNTER — Other Ambulatory Visit: Payer: Self-pay | Admitting: Family Medicine

## 2011-12-25 DIAGNOSIS — E7800 Pure hypercholesterolemia, unspecified: Secondary | ICD-10-CM | POA: Diagnosis not present

## 2011-12-25 DIAGNOSIS — Z1231 Encounter for screening mammogram for malignant neoplasm of breast: Secondary | ICD-10-CM

## 2011-12-25 DIAGNOSIS — I8390 Asymptomatic varicose veins of unspecified lower extremity: Secondary | ICD-10-CM | POA: Diagnosis not present

## 2011-12-25 DIAGNOSIS — E119 Type 2 diabetes mellitus without complications: Secondary | ICD-10-CM | POA: Diagnosis not present

## 2012-01-04 ENCOUNTER — Ambulatory Visit
Admission: RE | Admit: 2012-01-04 | Discharge: 2012-01-04 | Disposition: A | Discharge status: Home / Self Care | Payer: Medicare Other | Source: Ambulatory Visit | Attending: Family Medicine | Admitting: Family Medicine

## 2012-01-04 DIAGNOSIS — Z1231 Encounter for screening mammogram for malignant neoplasm of breast: Secondary | ICD-10-CM | POA: Diagnosis not present

## 2012-01-11 ENCOUNTER — Ambulatory Visit: Payer: Medicare Other

## 2012-02-04 DIAGNOSIS — Z23 Encounter for immunization: Secondary | ICD-10-CM | POA: Diagnosis not present

## 2012-04-22 DIAGNOSIS — I10 Essential (primary) hypertension: Secondary | ICD-10-CM | POA: Diagnosis not present

## 2012-04-22 DIAGNOSIS — E119 Type 2 diabetes mellitus without complications: Secondary | ICD-10-CM | POA: Diagnosis not present

## 2012-04-22 DIAGNOSIS — R0602 Shortness of breath: Secondary | ICD-10-CM | POA: Diagnosis not present

## 2012-04-29 ENCOUNTER — Telehealth: Payer: Self-pay | Admitting: Oncology

## 2012-04-29 ENCOUNTER — Other Ambulatory Visit (HOSPITAL_BASED_OUTPATIENT_CLINIC_OR_DEPARTMENT_OTHER): Payer: Medicare Other | Admitting: Lab

## 2012-04-29 ENCOUNTER — Ambulatory Visit (HOSPITAL_BASED_OUTPATIENT_CLINIC_OR_DEPARTMENT_OTHER): Payer: Medicare Other | Admitting: Oncology

## 2012-04-29 VITALS — BP 132/71 | HR 81 | Temp 97.2°F | Resp 18 | Ht 64.0 in | Wt 137.2 lb

## 2012-04-29 DIAGNOSIS — G620 Drug-induced polyneuropathy: Secondary | ICD-10-CM

## 2012-04-29 DIAGNOSIS — C189 Malignant neoplasm of colon, unspecified: Secondary | ICD-10-CM | POA: Diagnosis not present

## 2012-04-29 NOTE — Progress Notes (Signed)
   [facility]    OFFICE PROGRESS NOTE   INTERVAL HISTORY:   She returns as scheduled. She feels well. Good appetite and energy level. Stable numbness and discomfort at the soles of the feet. No new complaint.  Objective:  Vital signs in last 24 hours:  Blood pressure 132/71, pulse 81, temperature 97.2 F (36.2 C), temperature source Oral, resp. rate 18, height 5\' 4"  (1.626 m), weight 137 lb 3.2 oz (62.234 kg).    HEENT: Neck without mass Lymphatics: No cervical, supraclavicular, axillary, or inguinal nodes Resp: Lungs clear bilaterally Cardio: Regular rate and rhythm GI: No hepatomegaly, no mass Vascular: No leg edema   Lab Results:  CEA pending   Medications: I have reviewed the patient's current medications.  Assessment/Plan: 1.Locally advanced colon cancer diagnosed in October 2005. She remains in clinical remission. A re-staging CT August 30, 2007, showed no evidence for metastatic disease.  2. Small bowel obstruction secondary to adhesions in June 2006.  3. 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  4. History of colon polyps - she continues colonoscopy followup by Dr Loreta Ave. Next colonoscopy is scheduled for September of 2014. 5. History of renal insufficiency - followed by Dr Parke Simmers.  6. Hypertension - followed by Dr Parke Simmers.  7. Small lesion in the spleen on a CT April 05, 2007 - not seen on a CT August 30, 2007.  Disposition:  She remains in clinical remission from colon cancer. We will followup on the CEA from today. Ms. Mittelman would like to continue followup at the cancer Center. She will return for an office visit and CEA in one year.   Thornton Papas, MD  04/29/2012  12:04 PM

## 2012-04-29 NOTE — Telephone Encounter (Signed)
gv and printed appt scheduling to pt for Dec 2014

## 2012-05-04 ENCOUNTER — Telehealth: Payer: Self-pay | Admitting: *Deleted

## 2012-05-04 NOTE — Telephone Encounter (Signed)
Message copied by Wandalee Ferdinand on Wed May 04, 2012  9:49 AM ------      Message from: Ladene Artist      Created: Sat Apr 30, 2012 10:13 AM       Please call patient, cea is normal

## 2012-05-04 NOTE — Telephone Encounter (Signed)
Notified patient of normal CEA.

## 2012-08-22 DIAGNOSIS — E119 Type 2 diabetes mellitus without complications: Secondary | ICD-10-CM | POA: Diagnosis not present

## 2012-08-22 DIAGNOSIS — E781 Pure hyperglyceridemia: Secondary | ICD-10-CM | POA: Diagnosis not present

## 2012-08-22 DIAGNOSIS — E7800 Pure hypercholesterolemia, unspecified: Secondary | ICD-10-CM | POA: Diagnosis not present

## 2012-08-22 DIAGNOSIS — L259 Unspecified contact dermatitis, unspecified cause: Secondary | ICD-10-CM | POA: Diagnosis not present

## 2012-08-22 DIAGNOSIS — I10 Essential (primary) hypertension: Secondary | ICD-10-CM | POA: Diagnosis not present

## 2012-11-29 ENCOUNTER — Other Ambulatory Visit: Payer: Self-pay

## 2012-11-29 DIAGNOSIS — Z1231 Encounter for screening mammogram for malignant neoplasm of breast: Secondary | ICD-10-CM

## 2012-12-21 DIAGNOSIS — I10 Essential (primary) hypertension: Secondary | ICD-10-CM | POA: Diagnosis not present

## 2012-12-21 DIAGNOSIS — E781 Pure hyperglyceridemia: Secondary | ICD-10-CM | POA: Diagnosis not present

## 2012-12-21 DIAGNOSIS — E119 Type 2 diabetes mellitus without complications: Secondary | ICD-10-CM | POA: Diagnosis not present

## 2012-12-21 DIAGNOSIS — E7800 Pure hypercholesterolemia, unspecified: Secondary | ICD-10-CM | POA: Diagnosis not present

## 2013-01-04 ENCOUNTER — Ambulatory Visit
Admission: RE | Admit: 2013-01-04 | Discharge: 2013-01-04 | Disposition: A | Discharge status: Home / Self Care | Payer: Medicare Other | Source: Ambulatory Visit

## 2013-01-04 DIAGNOSIS — Z1231 Encounter for screening mammogram for malignant neoplasm of breast: Secondary | ICD-10-CM

## 2013-01-20 DIAGNOSIS — H52229 Regular astigmatism, unspecified eye: Secondary | ICD-10-CM | POA: Diagnosis not present

## 2013-01-20 DIAGNOSIS — H2510 Age-related nuclear cataract, unspecified eye: Secondary | ICD-10-CM | POA: Diagnosis not present

## 2013-01-20 DIAGNOSIS — H524 Presbyopia: Secondary | ICD-10-CM | POA: Diagnosis not present

## 2013-01-20 DIAGNOSIS — H5200 Hypermetropia, unspecified eye: Secondary | ICD-10-CM | POA: Diagnosis not present

## 2013-03-02 DIAGNOSIS — Z23 Encounter for immunization: Secondary | ICD-10-CM | POA: Diagnosis not present

## 2013-03-09 DIAGNOSIS — M5137 Other intervertebral disc degeneration, lumbosacral region: Secondary | ICD-10-CM | POA: Diagnosis not present

## 2013-03-09 DIAGNOSIS — M47817 Spondylosis without myelopathy or radiculopathy, lumbosacral region: Secondary | ICD-10-CM | POA: Diagnosis not present

## 2013-03-09 DIAGNOSIS — M545 Low back pain: Secondary | ICD-10-CM | POA: Diagnosis not present

## 2013-03-20 DIAGNOSIS — M5137 Other intervertebral disc degeneration, lumbosacral region: Secondary | ICD-10-CM | POA: Diagnosis not present

## 2013-03-20 DIAGNOSIS — IMO0002 Reserved for concepts with insufficient information to code with codable children: Secondary | ICD-10-CM | POA: Diagnosis not present

## 2013-03-20 DIAGNOSIS — M999 Biomechanical lesion, unspecified: Secondary | ICD-10-CM | POA: Diagnosis not present

## 2013-03-22 DIAGNOSIS — IMO0002 Reserved for concepts with insufficient information to code with codable children: Secondary | ICD-10-CM | POA: Diagnosis not present

## 2013-03-22 DIAGNOSIS — M999 Biomechanical lesion, unspecified: Secondary | ICD-10-CM | POA: Diagnosis not present

## 2013-03-22 DIAGNOSIS — M5137 Other intervertebral disc degeneration, lumbosacral region: Secondary | ICD-10-CM | POA: Diagnosis not present

## 2013-03-23 DIAGNOSIS — IMO0002 Reserved for concepts with insufficient information to code with codable children: Secondary | ICD-10-CM | POA: Diagnosis not present

## 2013-03-23 DIAGNOSIS — M5137 Other intervertebral disc degeneration, lumbosacral region: Secondary | ICD-10-CM | POA: Diagnosis not present

## 2013-03-23 DIAGNOSIS — M47817 Spondylosis without myelopathy or radiculopathy, lumbosacral region: Secondary | ICD-10-CM | POA: Diagnosis not present

## 2013-03-23 DIAGNOSIS — M545 Low back pain: Secondary | ICD-10-CM | POA: Diagnosis not present

## 2013-03-23 DIAGNOSIS — M999 Biomechanical lesion, unspecified: Secondary | ICD-10-CM | POA: Diagnosis not present

## 2013-03-27 DIAGNOSIS — M545 Low back pain: Secondary | ICD-10-CM | POA: Diagnosis not present

## 2013-03-27 DIAGNOSIS — M5137 Other intervertebral disc degeneration, lumbosacral region: Secondary | ICD-10-CM | POA: Diagnosis not present

## 2013-03-27 DIAGNOSIS — IMO0002 Reserved for concepts with insufficient information to code with codable children: Secondary | ICD-10-CM | POA: Diagnosis not present

## 2013-03-27 DIAGNOSIS — M47817 Spondylosis without myelopathy or radiculopathy, lumbosacral region: Secondary | ICD-10-CM | POA: Diagnosis not present

## 2013-03-27 DIAGNOSIS — M999 Biomechanical lesion, unspecified: Secondary | ICD-10-CM | POA: Diagnosis not present

## 2013-03-28 DIAGNOSIS — IMO0002 Reserved for concepts with insufficient information to code with codable children: Secondary | ICD-10-CM | POA: Diagnosis not present

## 2013-03-28 DIAGNOSIS — M5137 Other intervertebral disc degeneration, lumbosacral region: Secondary | ICD-10-CM | POA: Diagnosis not present

## 2013-03-28 DIAGNOSIS — M999 Biomechanical lesion, unspecified: Secondary | ICD-10-CM | POA: Diagnosis not present

## 2013-03-30 DIAGNOSIS — IMO0002 Reserved for concepts with insufficient information to code with codable children: Secondary | ICD-10-CM | POA: Diagnosis not present

## 2013-03-30 DIAGNOSIS — M999 Biomechanical lesion, unspecified: Secondary | ICD-10-CM | POA: Diagnosis not present

## 2013-03-30 DIAGNOSIS — M5137 Other intervertebral disc degeneration, lumbosacral region: Secondary | ICD-10-CM | POA: Diagnosis not present

## 2013-04-03 DIAGNOSIS — IMO0002 Reserved for concepts with insufficient information to code with codable children: Secondary | ICD-10-CM | POA: Diagnosis not present

## 2013-04-03 DIAGNOSIS — M999 Biomechanical lesion, unspecified: Secondary | ICD-10-CM | POA: Diagnosis not present

## 2013-04-03 DIAGNOSIS — M5137 Other intervertebral disc degeneration, lumbosacral region: Secondary | ICD-10-CM | POA: Diagnosis not present

## 2013-04-04 DIAGNOSIS — M999 Biomechanical lesion, unspecified: Secondary | ICD-10-CM | POA: Diagnosis not present

## 2013-04-04 DIAGNOSIS — IMO0002 Reserved for concepts with insufficient information to code with codable children: Secondary | ICD-10-CM | POA: Diagnosis not present

## 2013-04-04 DIAGNOSIS — M5137 Other intervertebral disc degeneration, lumbosacral region: Secondary | ICD-10-CM | POA: Diagnosis not present

## 2013-04-06 DIAGNOSIS — IMO0002 Reserved for concepts with insufficient information to code with codable children: Secondary | ICD-10-CM | POA: Diagnosis not present

## 2013-04-06 DIAGNOSIS — M5137 Other intervertebral disc degeneration, lumbosacral region: Secondary | ICD-10-CM | POA: Diagnosis not present

## 2013-04-06 DIAGNOSIS — M999 Biomechanical lesion, unspecified: Secondary | ICD-10-CM | POA: Diagnosis not present

## 2013-04-10 DIAGNOSIS — M5137 Other intervertebral disc degeneration, lumbosacral region: Secondary | ICD-10-CM | POA: Diagnosis not present

## 2013-04-10 DIAGNOSIS — IMO0002 Reserved for concepts with insufficient information to code with codable children: Secondary | ICD-10-CM | POA: Diagnosis not present

## 2013-04-10 DIAGNOSIS — M999 Biomechanical lesion, unspecified: Secondary | ICD-10-CM | POA: Diagnosis not present

## 2013-04-11 DIAGNOSIS — IMO0002 Reserved for concepts with insufficient information to code with codable children: Secondary | ICD-10-CM | POA: Diagnosis not present

## 2013-04-11 DIAGNOSIS — M5137 Other intervertebral disc degeneration, lumbosacral region: Secondary | ICD-10-CM | POA: Diagnosis not present

## 2013-04-11 DIAGNOSIS — M999 Biomechanical lesion, unspecified: Secondary | ICD-10-CM | POA: Diagnosis not present

## 2013-04-18 DIAGNOSIS — M999 Biomechanical lesion, unspecified: Secondary | ICD-10-CM | POA: Diagnosis not present

## 2013-04-18 DIAGNOSIS — IMO0002 Reserved for concepts with insufficient information to code with codable children: Secondary | ICD-10-CM | POA: Diagnosis not present

## 2013-04-18 DIAGNOSIS — M5137 Other intervertebral disc degeneration, lumbosacral region: Secondary | ICD-10-CM | POA: Diagnosis not present

## 2013-04-20 DIAGNOSIS — IMO0002 Reserved for concepts with insufficient information to code with codable children: Secondary | ICD-10-CM | POA: Diagnosis not present

## 2013-04-20 DIAGNOSIS — M999 Biomechanical lesion, unspecified: Secondary | ICD-10-CM | POA: Diagnosis not present

## 2013-04-20 DIAGNOSIS — M5137 Other intervertebral disc degeneration, lumbosacral region: Secondary | ICD-10-CM | POA: Diagnosis not present

## 2013-04-25 DIAGNOSIS — M999 Biomechanical lesion, unspecified: Secondary | ICD-10-CM | POA: Diagnosis not present

## 2013-04-25 DIAGNOSIS — IMO0002 Reserved for concepts with insufficient information to code with codable children: Secondary | ICD-10-CM | POA: Diagnosis not present

## 2013-04-25 DIAGNOSIS — M5137 Other intervertebral disc degeneration, lumbosacral region: Secondary | ICD-10-CM | POA: Diagnosis not present

## 2013-04-27 DIAGNOSIS — M999 Biomechanical lesion, unspecified: Secondary | ICD-10-CM | POA: Diagnosis not present

## 2013-04-27 DIAGNOSIS — M5137 Other intervertebral disc degeneration, lumbosacral region: Secondary | ICD-10-CM | POA: Diagnosis not present

## 2013-04-27 DIAGNOSIS — IMO0002 Reserved for concepts with insufficient information to code with codable children: Secondary | ICD-10-CM | POA: Diagnosis not present

## 2013-04-28 ENCOUNTER — Other Ambulatory Visit (HOSPITAL_BASED_OUTPATIENT_CLINIC_OR_DEPARTMENT_OTHER): Payer: Medicare Other

## 2013-04-28 ENCOUNTER — Telehealth: Payer: Self-pay | Admitting: *Deleted

## 2013-04-28 ENCOUNTER — Encounter (INDEPENDENT_AMBULATORY_CARE_PROVIDER_SITE_OTHER): Payer: Self-pay

## 2013-04-28 ENCOUNTER — Telehealth: Payer: Self-pay | Admitting: Oncology

## 2013-04-28 ENCOUNTER — Ambulatory Visit (HOSPITAL_BASED_OUTPATIENT_CLINIC_OR_DEPARTMENT_OTHER): Payer: Medicare Other | Admitting: Oncology

## 2013-04-28 VITALS — BP 148/73 | HR 89 | Temp 97.7°F | Resp 20 | Ht 64.0 in | Wt 138.4 lb

## 2013-04-28 DIAGNOSIS — G620 Drug-induced polyneuropathy: Secondary | ICD-10-CM

## 2013-04-28 DIAGNOSIS — C189 Malignant neoplasm of colon, unspecified: Secondary | ICD-10-CM

## 2013-04-28 LAB — CEA: CEA: 2.3 ng/mL (ref 0.0–5.0)

## 2013-04-28 NOTE — Telephone Encounter (Signed)
Notified patient via VM of CEA result (recognized her voice on machine)

## 2013-04-28 NOTE — Progress Notes (Signed)
   [facility]    OFFICE PROGRESS NOTE   INTERVAL HISTORY:   She returns for scheduled followup of colon cancer. She feels well. She developed back pain after a fall in July. She reports improvement after seeing a chiropractor. No other complaint. Stable mild numbness at the distal foot bilaterally. No finger numbness.  Objective:  Vital signs in last 24 hours:  Blood pressure 148/73, pulse 89, temperature 97.7 F (36.5 C), temperature source Oral, resp. rate 20, height 5\' 4"  (1.626 m), weight 138 lb 6.4 oz (62.778 kg).    HEENT: Neck without mass Lymphatics: No cervical, supraclavicular, axillary, or inguinal nodes Resp: Lungs clear bilaterally Cardio: Regular rate and rhythm GI: No hepatomegaly, no mass Vascular: No leg edema   Lab Results:  CEA pending   Medications: I have reviewed the patient's current medications.  Assessment/Plan: 1.Locally advanced colon cancer diagnosed in October 2005. She remains in clinical remission. A re-staging CT August 30, 2007, showed no evidence for metastatic disease.  2. Small bowel obstruction secondary to adhesions in June 2006.  3. 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  4. History of colon polyps - she continues colonoscopy followup by Dr Loreta Ave. Last colonoscopy 02/10/2010, 3 small rectosigmoid polyps were removed and the pathology revealed superficial hyperplastic changes, possibly representing a diminutive hyperplastic polyp. 5. History of renal insufficiency - followed by Dr Parke Simmers.  6. Hypertension - followed by Dr Parke Simmers.  7. Small lesion in the spleen on a CT April 05, 2007 - not seen on a CT August 30, 2007.     Disposition:  She remains in clinical remission from colon cancer. She would like to continue followup at the [facility]. We will followup on the CEA from today. Ms. Drury will return for an office visit in one year. She continues colonoscopy followup with Dr.  Loreta Ave.   Thornton Papas, MD  04/28/2013  10:29 AM

## 2013-04-28 NOTE — Telephone Encounter (Signed)
Message copied by Wandalee Ferdinand on Fri Apr 28, 2013  4:04 PM ------      Message from: Ladene Artist      Created: Fri Apr 28, 2013  3:55 PM       Please call patient, cea is normal ------

## 2013-04-28 NOTE — Telephone Encounter (Signed)
gv and printed appt sched and avs for pt for DEC 2015...  °

## 2013-05-02 DIAGNOSIS — M999 Biomechanical lesion, unspecified: Secondary | ICD-10-CM | POA: Diagnosis not present

## 2013-05-02 DIAGNOSIS — IMO0002 Reserved for concepts with insufficient information to code with codable children: Secondary | ICD-10-CM | POA: Diagnosis not present

## 2013-05-02 DIAGNOSIS — M5137 Other intervertebral disc degeneration, lumbosacral region: Secondary | ICD-10-CM | POA: Diagnosis not present

## 2013-05-04 DIAGNOSIS — M999 Biomechanical lesion, unspecified: Secondary | ICD-10-CM | POA: Diagnosis not present

## 2013-05-04 DIAGNOSIS — IMO0002 Reserved for concepts with insufficient information to code with codable children: Secondary | ICD-10-CM | POA: Diagnosis not present

## 2013-05-04 DIAGNOSIS — M5137 Other intervertebral disc degeneration, lumbosacral region: Secondary | ICD-10-CM | POA: Diagnosis not present

## 2013-05-05 ENCOUNTER — Telehealth: Payer: Self-pay | Admitting: *Deleted

## 2013-05-05 NOTE — Telephone Encounter (Signed)
Call from pt to make Dr. Truett Perna aware her next colonoscopy will be scheduled for 01/2015. Will forward info to MD.

## 2013-05-08 DIAGNOSIS — IMO0002 Reserved for concepts with insufficient information to code with codable children: Secondary | ICD-10-CM | POA: Diagnosis not present

## 2013-05-08 DIAGNOSIS — M5137 Other intervertebral disc degeneration, lumbosacral region: Secondary | ICD-10-CM | POA: Diagnosis not present

## 2013-05-08 DIAGNOSIS — M999 Biomechanical lesion, unspecified: Secondary | ICD-10-CM | POA: Diagnosis not present

## 2013-05-09 DIAGNOSIS — M999 Biomechanical lesion, unspecified: Secondary | ICD-10-CM | POA: Diagnosis not present

## 2013-05-09 DIAGNOSIS — M5137 Other intervertebral disc degeneration, lumbosacral region: Secondary | ICD-10-CM | POA: Diagnosis not present

## 2013-05-09 DIAGNOSIS — IMO0002 Reserved for concepts with insufficient information to code with codable children: Secondary | ICD-10-CM | POA: Diagnosis not present

## 2013-05-16 DIAGNOSIS — M5137 Other intervertebral disc degeneration, lumbosacral region: Secondary | ICD-10-CM | POA: Diagnosis not present

## 2013-05-16 DIAGNOSIS — IMO0002 Reserved for concepts with insufficient information to code with codable children: Secondary | ICD-10-CM | POA: Diagnosis not present

## 2013-05-16 DIAGNOSIS — M999 Biomechanical lesion, unspecified: Secondary | ICD-10-CM | POA: Diagnosis not present

## 2013-05-22 DIAGNOSIS — I10 Essential (primary) hypertension: Secondary | ICD-10-CM | POA: Diagnosis not present

## 2013-05-22 DIAGNOSIS — E7800 Pure hypercholesterolemia, unspecified: Secondary | ICD-10-CM | POA: Diagnosis not present

## 2013-05-25 DIAGNOSIS — IMO0002 Reserved for concepts with insufficient information to code with codable children: Secondary | ICD-10-CM | POA: Diagnosis not present

## 2013-05-25 DIAGNOSIS — M999 Biomechanical lesion, unspecified: Secondary | ICD-10-CM | POA: Diagnosis not present

## 2013-05-25 DIAGNOSIS — M5137 Other intervertebral disc degeneration, lumbosacral region: Secondary | ICD-10-CM | POA: Diagnosis not present

## 2013-06-01 DIAGNOSIS — M5137 Other intervertebral disc degeneration, lumbosacral region: Secondary | ICD-10-CM | POA: Diagnosis not present

## 2013-06-01 DIAGNOSIS — M999 Biomechanical lesion, unspecified: Secondary | ICD-10-CM | POA: Diagnosis not present

## 2013-06-01 DIAGNOSIS — IMO0002 Reserved for concepts with insufficient information to code with codable children: Secondary | ICD-10-CM | POA: Diagnosis not present

## 2013-06-08 DIAGNOSIS — IMO0002 Reserved for concepts with insufficient information to code with codable children: Secondary | ICD-10-CM | POA: Diagnosis not present

## 2013-06-08 DIAGNOSIS — M5137 Other intervertebral disc degeneration, lumbosacral region: Secondary | ICD-10-CM | POA: Diagnosis not present

## 2013-06-08 DIAGNOSIS — M999 Biomechanical lesion, unspecified: Secondary | ICD-10-CM | POA: Diagnosis not present

## 2013-07-11 DIAGNOSIS — M999 Biomechanical lesion, unspecified: Secondary | ICD-10-CM | POA: Diagnosis not present

## 2013-07-11 DIAGNOSIS — M5137 Other intervertebral disc degeneration, lumbosacral region: Secondary | ICD-10-CM | POA: Diagnosis not present

## 2013-07-11 DIAGNOSIS — IMO0002 Reserved for concepts with insufficient information to code with codable children: Secondary | ICD-10-CM | POA: Diagnosis not present

## 2013-08-07 DIAGNOSIS — M999 Biomechanical lesion, unspecified: Secondary | ICD-10-CM | POA: Diagnosis not present

## 2013-08-07 DIAGNOSIS — M5137 Other intervertebral disc degeneration, lumbosacral region: Secondary | ICD-10-CM | POA: Diagnosis not present

## 2013-08-07 DIAGNOSIS — IMO0002 Reserved for concepts with insufficient information to code with codable children: Secondary | ICD-10-CM | POA: Diagnosis not present

## 2013-09-20 DIAGNOSIS — I10 Essential (primary) hypertension: Secondary | ICD-10-CM | POA: Diagnosis not present

## 2013-09-20 DIAGNOSIS — E119 Type 2 diabetes mellitus without complications: Secondary | ICD-10-CM | POA: Diagnosis not present

## 2013-12-22 ENCOUNTER — Other Ambulatory Visit: Payer: Self-pay

## 2013-12-22 DIAGNOSIS — Z1231 Encounter for screening mammogram for malignant neoplasm of breast: Secondary | ICD-10-CM

## 2013-12-26 ENCOUNTER — Telehealth: Payer: Self-pay | Admitting: Oncology

## 2013-12-26 NOTE — Telephone Encounter (Signed)
Pt cld to confirm apt, confirmed w/pt and mailed copy out..>KJ

## 2014-01-23 ENCOUNTER — Ambulatory Visit
Admission: RE | Admit: 2014-01-23 | Discharge: 2014-01-23 | Disposition: A | Discharge status: Home / Self Care | Payer: Medicare Other | Source: Ambulatory Visit

## 2014-01-23 DIAGNOSIS — Z1231 Encounter for screening mammogram for malignant neoplasm of breast: Secondary | ICD-10-CM

## 2014-01-23 DIAGNOSIS — E119 Type 2 diabetes mellitus without complications: Secondary | ICD-10-CM | POA: Diagnosis not present

## 2014-01-23 DIAGNOSIS — I10 Essential (primary) hypertension: Secondary | ICD-10-CM | POA: Diagnosis not present

## 2014-01-23 DIAGNOSIS — E7800 Pure hypercholesterolemia, unspecified: Secondary | ICD-10-CM | POA: Diagnosis not present

## 2014-01-26 DIAGNOSIS — H524 Presbyopia: Secondary | ICD-10-CM | POA: Diagnosis not present

## 2014-01-26 DIAGNOSIS — H5200 Hypermetropia, unspecified eye: Secondary | ICD-10-CM | POA: Diagnosis not present

## 2014-01-26 DIAGNOSIS — H52229 Regular astigmatism, unspecified eye: Secondary | ICD-10-CM | POA: Diagnosis not present

## 2014-01-26 DIAGNOSIS — H2510 Age-related nuclear cataract, unspecified eye: Secondary | ICD-10-CM | POA: Diagnosis not present

## 2014-02-24 DIAGNOSIS — Z23 Encounter for immunization: Secondary | ICD-10-CM | POA: Diagnosis not present

## 2014-04-25 DIAGNOSIS — F5102 Adjustment insomnia: Secondary | ICD-10-CM | POA: Diagnosis not present

## 2014-04-25 DIAGNOSIS — H8309 Labyrinthitis, unspecified ear: Secondary | ICD-10-CM | POA: Diagnosis not present

## 2014-04-27 ENCOUNTER — Telehealth: Payer: Self-pay | Admitting: Oncology

## 2014-04-27 ENCOUNTER — Ambulatory Visit (HOSPITAL_BASED_OUTPATIENT_CLINIC_OR_DEPARTMENT_OTHER): Payer: Medicare Other | Admitting: Oncology

## 2014-04-27 ENCOUNTER — Other Ambulatory Visit (HOSPITAL_BASED_OUTPATIENT_CLINIC_OR_DEPARTMENT_OTHER): Payer: Medicare Other

## 2014-04-27 VITALS — BP 138/69 | HR 98 | Temp 98.0°F | Resp 18 | Ht 64.0 in | Wt 135.7 lb

## 2014-04-27 DIAGNOSIS — Z85038 Personal history of other malignant neoplasm of large intestine: Secondary | ICD-10-CM | POA: Diagnosis not present

## 2014-04-27 DIAGNOSIS — C189 Malignant neoplasm of colon, unspecified: Secondary | ICD-10-CM

## 2014-04-27 NOTE — Telephone Encounter (Signed)
Gave avs & cal for Dec 2016. °

## 2014-04-27 NOTE — Progress Notes (Signed)
  [facility] OFFICE PROGRESS NOTE   Diagnosis: Colon cancer  INTERVAL HISTORY:   Gwendolyn Nelson returns as scheduled. She feels well. No difficulty with bowel function. She recently had an episode of vertigo that resolved after taking Dramamine. She continues to have mild numbness in the feet. This does not interfere with activity.  Objective:  Vital signs in last 24 hours:  Blood pressure 138/69, pulse 98, temperature 98 F (36.7 C), temperature source Oral, resp. rate 18, height 5\' 4"  (1.626 m), weight 135 lb 11.2 oz (61.553 kg).    HEENT: Neck without mass Lymphatics: No cervical, supra-clavicular, axillary, or inguinal nodes Resp: Lungs clear bilaterally Cardio: Regular rate and rhythm GI: No hepatomegaly, nontender, no mass Vascular: No leg edema  Lab Results:   Lab Results  Component Value Date   CEA 2.3 04/28/2013   Medications: I have reviewed the patient's current medications.  Assessment/Plan: 1.Locally advanced colon cancer diagnosed in October 2005. She remains in clinical remission. A re-staging CT August 30, 2007, showed no evidence for metastatic disease.  2. Small bowel obstruction secondary to adhesions in June 2006.  3. 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  4. History of colon polyps - she continues colonoscopy followup by Dr Collene Mares. Last colonoscopy 02/10/2010, 3 small rectosigmoid polyps were removed and the pathology revealed superficial hyperplastic changes, possibly representing a diminutive hyperplastic polyp. 5. History of renal insufficiency - followed by Dr Criss Rosales.  6. Hypertension - followed by Dr Criss Rosales.  7. Small lesion in the spleen on a CT April 05, 2007 - not seen on a CT August 30, 2007.    Disposition:  Gwendolyn Nelson remains in clinical remission from colon cancer. She will return for an office visit and CEA in one year.  She will contact Dr. Collene Mares to schedule a follow-up colonoscopy.  Betsy Coder, MD  04/27/2014  11:39 AM

## 2014-04-28 LAB — CEA: CEA: 2.4 ng/mL (ref 0.0–5.0)

## 2014-04-30 ENCOUNTER — Telehealth: Payer: Self-pay | Admitting: *Deleted

## 2014-04-30 NOTE — Telephone Encounter (Signed)
-----   Message from Ladell Pier, MD sent at 04/30/2014  8:30 AM EST ----- Please call patient,cea  Is normal

## 2014-04-30 NOTE — Telephone Encounter (Signed)
Per Dr. Benay Spice; left voice message that cea is normal and if any questions to call office.

## 2014-05-23 DIAGNOSIS — I10 Essential (primary) hypertension: Secondary | ICD-10-CM | POA: Diagnosis not present

## 2014-05-23 DIAGNOSIS — E782 Mixed hyperlipidemia: Secondary | ICD-10-CM | POA: Diagnosis not present

## 2014-05-23 DIAGNOSIS — F5102 Adjustment insomnia: Secondary | ICD-10-CM | POA: Diagnosis not present

## 2014-05-23 DIAGNOSIS — R7309 Other abnormal glucose: Secondary | ICD-10-CM | POA: Diagnosis not present

## 2014-09-24 DIAGNOSIS — M130 Polyarthritis, unspecified: Secondary | ICD-10-CM | POA: Diagnosis not present

## 2014-09-24 DIAGNOSIS — E782 Mixed hyperlipidemia: Secondary | ICD-10-CM | POA: Diagnosis not present

## 2014-09-24 DIAGNOSIS — R7309 Other abnormal glucose: Secondary | ICD-10-CM | POA: Diagnosis not present

## 2014-09-24 DIAGNOSIS — I10 Essential (primary) hypertension: Secondary | ICD-10-CM | POA: Diagnosis not present

## 2014-09-24 DIAGNOSIS — M1310 Monoarthritis, not elsewhere classified, unspecified site: Secondary | ICD-10-CM | POA: Diagnosis not present

## 2014-10-03 DIAGNOSIS — I10 Essential (primary) hypertension: Secondary | ICD-10-CM | POA: Diagnosis not present

## 2014-12-24 ENCOUNTER — Other Ambulatory Visit: Payer: Self-pay

## 2014-12-24 DIAGNOSIS — Z1231 Encounter for screening mammogram for malignant neoplasm of breast: Secondary | ICD-10-CM

## 2014-12-25 DIAGNOSIS — Z85038 Personal history of other malignant neoplasm of large intestine: Secondary | ICD-10-CM | POA: Diagnosis not present

## 2014-12-25 DIAGNOSIS — Z1211 Encounter for screening for malignant neoplasm of colon: Secondary | ICD-10-CM | POA: Diagnosis not present

## 2014-12-25 DIAGNOSIS — K5730 Diverticulosis of large intestine without perforation or abscess without bleeding: Secondary | ICD-10-CM | POA: Diagnosis not present

## 2014-12-28 DIAGNOSIS — J209 Acute bronchitis, unspecified: Secondary | ICD-10-CM | POA: Diagnosis not present

## 2014-12-28 DIAGNOSIS — R05 Cough: Secondary | ICD-10-CM | POA: Diagnosis not present

## 2015-01-08 DIAGNOSIS — R05 Cough: Secondary | ICD-10-CM | POA: Diagnosis not present

## 2015-01-17 DIAGNOSIS — B349 Viral infection, unspecified: Secondary | ICD-10-CM | POA: Diagnosis not present

## 2015-01-17 DIAGNOSIS — R067 Sneezing: Secondary | ICD-10-CM | POA: Diagnosis not present

## 2015-01-23 DIAGNOSIS — N898 Other specified noninflammatory disorders of vagina: Secondary | ICD-10-CM | POA: Diagnosis not present

## 2015-01-23 DIAGNOSIS — R42 Dizziness and giddiness: Secondary | ICD-10-CM | POA: Diagnosis not present

## 2015-01-23 DIAGNOSIS — I951 Orthostatic hypotension: Secondary | ICD-10-CM | POA: Diagnosis not present

## 2015-01-23 DIAGNOSIS — N390 Urinary tract infection, site not specified: Secondary | ICD-10-CM | POA: Diagnosis not present

## 2015-01-24 DIAGNOSIS — R42 Dizziness and giddiness: Secondary | ICD-10-CM | POA: Diagnosis not present

## 2015-01-24 DIAGNOSIS — N898 Other specified noninflammatory disorders of vagina: Secondary | ICD-10-CM | POA: Diagnosis not present

## 2015-01-24 DIAGNOSIS — R5383 Other fatigue: Secondary | ICD-10-CM | POA: Diagnosis not present

## 2015-01-29 ENCOUNTER — Ambulatory Visit
Admission: RE | Admit: 2015-01-29 | Discharge: 2015-01-29 | Disposition: A | Discharge status: Home / Self Care | Payer: Medicare Other | Source: Ambulatory Visit

## 2015-01-29 DIAGNOSIS — E782 Mixed hyperlipidemia: Secondary | ICD-10-CM | POA: Diagnosis not present

## 2015-01-29 DIAGNOSIS — R7309 Other abnormal glucose: Secondary | ICD-10-CM | POA: Diagnosis not present

## 2015-01-29 DIAGNOSIS — Z1231 Encounter for screening mammogram for malignant neoplasm of breast: Secondary | ICD-10-CM

## 2015-01-29 DIAGNOSIS — M130 Polyarthritis, unspecified: Secondary | ICD-10-CM | POA: Diagnosis not present

## 2015-01-29 DIAGNOSIS — I10 Essential (primary) hypertension: Secondary | ICD-10-CM | POA: Diagnosis not present

## 2015-02-01 DIAGNOSIS — H2513 Age-related nuclear cataract, bilateral: Secondary | ICD-10-CM | POA: Diagnosis not present

## 2015-02-06 DIAGNOSIS — K5660 Unspecified intestinal obstruction: Secondary | ICD-10-CM | POA: Diagnosis not present

## 2015-02-06 DIAGNOSIS — N390 Urinary tract infection, site not specified: Secondary | ICD-10-CM | POA: Diagnosis not present

## 2015-02-06 DIAGNOSIS — E785 Hyperlipidemia, unspecified: Secondary | ICD-10-CM | POA: Diagnosis not present

## 2015-02-06 DIAGNOSIS — I10 Essential (primary) hypertension: Secondary | ICD-10-CM | POA: Diagnosis not present

## 2015-02-06 DIAGNOSIS — K5669 Other intestinal obstruction: Secondary | ICD-10-CM | POA: Diagnosis not present

## 2015-02-06 DIAGNOSIS — Z0289 Encounter for other administrative examinations: Secondary | ICD-10-CM | POA: Diagnosis not present

## 2015-02-06 DIAGNOSIS — Z9049 Acquired absence of other specified parts of digestive tract: Secondary | ICD-10-CM | POA: Diagnosis not present

## 2015-02-06 DIAGNOSIS — R1110 Vomiting, unspecified: Secondary | ICD-10-CM | POA: Diagnosis not present

## 2015-02-06 DIAGNOSIS — E860 Dehydration: Secondary | ICD-10-CM | POA: Diagnosis not present

## 2015-02-06 DIAGNOSIS — Z85038 Personal history of other malignant neoplasm of large intestine: Secondary | ICD-10-CM | POA: Diagnosis not present

## 2015-02-07 DIAGNOSIS — K5660 Unspecified intestinal obstruction: Secondary | ICD-10-CM | POA: Diagnosis not present

## 2015-02-07 DIAGNOSIS — E860 Dehydration: Secondary | ICD-10-CM | POA: Diagnosis present

## 2015-02-07 DIAGNOSIS — Z9049 Acquired absence of other specified parts of digestive tract: Secondary | ICD-10-CM | POA: Diagnosis present

## 2015-02-07 DIAGNOSIS — Z85038 Personal history of other malignant neoplasm of large intestine: Secondary | ICD-10-CM | POA: Diagnosis not present

## 2015-02-07 DIAGNOSIS — I10 Essential (primary) hypertension: Secondary | ICD-10-CM | POA: Diagnosis present

## 2015-02-07 DIAGNOSIS — E785 Hyperlipidemia, unspecified: Secondary | ICD-10-CM | POA: Diagnosis present

## 2015-02-11 DIAGNOSIS — I10 Essential (primary) hypertension: Secondary | ICD-10-CM | POA: Diagnosis not present

## 2015-02-13 ENCOUNTER — Telehealth: Payer: Self-pay

## 2015-02-13 NOTE — Telephone Encounter (Signed)
Pt LMOVM - recent ED visit for symptoms of blockage.  Pt would like to be seen prior to her annual followup in December - will be in [place] today.  Per Ned Card, patient cannot be seen today.  Recent hospital visit records are needed first.  Medical records request faxed to [facility].  Attempted to call back patient - no answer.    Triage RN Roz spoke with patient since pt left original message - see telephone note.

## 2015-02-13 NOTE — Telephone Encounter (Signed)
Triage pager message "Not feeling well, very shaky, please call."  Called patient who reports "shaking since since I woke up this morning like I'm cold but I am not.  I feel jittery or nervous.  B/P = 158/81 and I took Norvasc.  I was in the Hospital in [place] four days discharged on Saturday.  They pumped my stomach because they thought I had a blockage.  I saw Dr. Criss Rosales yesterday.  Told her about these shakes that started when I was in the Hospital that last a few ours and go away.  Dr. Criss Rosales said drink Gatorade because my body is depleted.  I'm drinking Gatorade.  Last BM was yesterday, soft and easy to pass.  I want to come in and have lab work now instead of waiting until annual F/U April 23, 2015.  My husband has to be in [place] at 1:15 today, it takes an hour for [place] to get there." Denies abdominal pain, swelling/bloating.  Checked temperature with this call, oral T = 98.06.  Denies being diabetic, insomnia, stress or anxiety.  Drinking coffee but says "the shakes started before the coffee.  I want to make sure I'm not having a stroke or something".  Reviewed Face symmetry, Arms and leg movement, Speech clear with this conversation and denies any speech problems.  Instructing to call 911 if these changes occur because these are stroke symptoms and Time to get help for these is vital for survival.  Colon cancer 2005 in clinical remission.  Instructed to call Dr. Fransico Setters office for these complaints of shakiness/jitteriness.Marland Kitchen

## 2015-02-13 NOTE — Telephone Encounter (Signed)
Opened in error

## 2015-02-14 MED ORDER — LORAZEPAM 0.5 MG PO TABS: 0.5000 mg | ORAL_TABLET | Freq: Two times a day (BID) | ORAL | Status: AC | PRN

## 2015-02-14 NOTE — Telephone Encounter (Addendum)
  Observed the following addendum notification. Called Mrs. Grygiel.  Reports she saw Dr. Criss Rosales yesterday and will F/U in a week.  I was given lorazepam 0.5 mg take one twice a day as needed.  I am not perfect but I am not as jittery.  It's not easy getting old.  I will be 80 yrs old on my birthday."  Will update medication list.  RWS   Ladell Pier, MD   Sent: Wed February 13, 2015 5:32 PM    To: Cherylynn Ridges, RN        Message     Needs to go to ER or primary MD with these symptoms.    We will see as needed    ----- Message -----     From: Cherylynn Ridges, RN     Sent: 02/13/2015 11:06 AM      To: Owens Shark, NP, Brien Few, RN, *

## 2015-02-14 NOTE — Addendum Note (Signed)
Addended by: Cherylynn Ridges on: 02/14/2015 04:54 PM   Modules accepted: Orders

## 2015-02-20 DIAGNOSIS — K759 Inflammatory liver disease, unspecified: Secondary | ICD-10-CM | POA: Diagnosis not present

## 2015-02-20 DIAGNOSIS — I10 Essential (primary) hypertension: Secondary | ICD-10-CM | POA: Diagnosis not present

## 2015-02-20 DIAGNOSIS — E876 Hypokalemia: Secondary | ICD-10-CM | POA: Diagnosis not present

## 2015-02-20 DIAGNOSIS — Z6824 Body mass index (BMI) 24.0-24.9, adult: Secondary | ICD-10-CM | POA: Diagnosis not present

## 2015-03-12 DIAGNOSIS — Z23 Encounter for immunization: Secondary | ICD-10-CM | POA: Diagnosis not present

## 2015-03-22 DIAGNOSIS — I10 Essential (primary) hypertension: Secondary | ICD-10-CM | POA: Diagnosis not present

## 2015-04-23 ENCOUNTER — Other Ambulatory Visit (HOSPITAL_BASED_OUTPATIENT_CLINIC_OR_DEPARTMENT_OTHER): Payer: Medicare Other

## 2015-04-23 ENCOUNTER — Telehealth: Payer: Self-pay | Admitting: Oncology

## 2015-04-23 ENCOUNTER — Ambulatory Visit (HOSPITAL_BASED_OUTPATIENT_CLINIC_OR_DEPARTMENT_OTHER): Payer: Medicare Other | Admitting: Oncology

## 2015-04-23 VITALS — BP 156/66 | HR 87 | Temp 97.9°F | Resp 16 | Ht 64.0 in | Wt 135.7 lb

## 2015-04-23 DIAGNOSIS — C189 Malignant neoplasm of colon, unspecified: Secondary | ICD-10-CM | POA: Diagnosis not present

## 2015-04-23 DIAGNOSIS — Z86010 Personal history of colonic polyps: Secondary | ICD-10-CM

## 2015-04-23 DIAGNOSIS — Z85038 Personal history of other malignant neoplasm of large intestine: Secondary | ICD-10-CM | POA: Diagnosis not present

## 2015-04-23 DIAGNOSIS — G620 Drug-induced polyneuropathy: Secondary | ICD-10-CM

## 2015-04-23 DIAGNOSIS — C182 Malignant neoplasm of ascending colon: Secondary | ICD-10-CM

## 2015-04-23 DIAGNOSIS — I10 Essential (primary) hypertension: Secondary | ICD-10-CM

## 2015-04-23 NOTE — Telephone Encounter (Signed)
Gave patient avs report and appointments for December 2017.

## 2015-04-23 NOTE — Progress Notes (Signed)
  [facility] OFFICE PROGRESS NOTE   Diagnosis: Colon cancer  INTERVAL HISTORY:   Gwendolyn Nelson returns as scheduled. She feels well. Good appetite. She is scheduled to see Dr. Collene Nelson next week to discuss the next colonoscopy. She reports being admitted for 3 days in [place] for abdominal pain in September. She reports the pain resolved and she was not diagnosed with a bowel obstruction.  She Needs to have mild neuropathy symptoms in the feet. This does not interfere with activity.  Objective:  Vital signs in last 24 hours:  Blood pressure 156/66, pulse 87, temperature 97.9 F (36.6 C), temperature source Oral, resp. rate 16, height 5\' 4"  (1.626 m), weight 135 lb 11.2 oz (61.553 kg), SpO2 100 %.    HEENT: Neck without mass Lymphatics: No cervical, supra-clavicular, axillary, or inguinal nodes Resp: Lungs clear bilaterally Cardio: Regular rate and rhythm GI: No hepatomegaly, no mass, nontender Vascular: No leg edema   Lab Results:   Lab Results  Component Value Date   CEA 2.4 04/27/2014     Medications: I have reviewed the patient's current medications.  Assessment/Plan: 1.Locally advanced colon cancer diagnosed in October 2005, stage IV (T3N1M1). M1 designation based on a distant metastatic lymph node . A re-staging CT August 30, 2007, showed no evidence for metastatic disease.  2. Small bowel obstruction secondary to adhesions in June 2006.  3. 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  4. History of colon polyps - she continues colonoscopy followup by Dr Gwendolyn Nelson. Last colonoscopy 02/10/2010, 3 small rectosigmoid polyps were removed and the pathology revealed superficial hyperplastic changes, possibly representing a diminutive hyperplastic polyp. 5. History of renal insufficiency - followed by Dr Gwendolyn Nelson.  6. Hypertension - followed by Dr Gwendolyn Nelson.  7. Small lesion in the spleen on a CT April 05, 2007 - not seen on a CT August 30, 2007.       Disposition:  Gwendolyn Nelson remains in clinical remission from colon cancer. We will follow-up on the CEA from today. She will return for an office visit and CEA in one year. She will see Dr. Collene Nelson next week to schedule a colonoscopy.  Gwendolyn Coder, MD  04/23/2015  3:12 PM

## 2015-04-24 LAB — CEA: CEA: 3 ng/mL (ref 0.0–5.0)

## 2015-04-25 ENCOUNTER — Telehealth: Payer: Self-pay | Admitting: *Deleted

## 2015-04-25 NOTE — Telephone Encounter (Signed)
Per Dr. Sherrill; left voice message that cea is normal; call office if questions. 

## 2015-04-25 NOTE — Telephone Encounter (Signed)
-----   Message from Ladell Pier, MD sent at 04/24/2015  5:12 PM EST ----- Please call patient, cea is normal

## 2015-04-30 DIAGNOSIS — Z800 Family history of malignant neoplasm of digestive organs: Secondary | ICD-10-CM | POA: Diagnosis not present

## 2015-04-30 DIAGNOSIS — K5730 Diverticulosis of large intestine without perforation or abscess without bleeding: Secondary | ICD-10-CM | POA: Diagnosis not present

## 2015-04-30 DIAGNOSIS — Z85038 Personal history of other malignant neoplasm of large intestine: Secondary | ICD-10-CM | POA: Diagnosis not present

## 2015-04-30 DIAGNOSIS — Z1211 Encounter for screening for malignant neoplasm of colon: Secondary | ICD-10-CM | POA: Diagnosis not present

## 2015-04-30 DIAGNOSIS — Z86010 Personal history of colonic polyps: Secondary | ICD-10-CM | POA: Diagnosis not present

## 2015-05-15 DIAGNOSIS — Z1211 Encounter for screening for malignant neoplasm of colon: Secondary | ICD-10-CM | POA: Diagnosis not present

## 2015-05-15 DIAGNOSIS — D128 Benign neoplasm of rectum: Secondary | ICD-10-CM | POA: Diagnosis not present

## 2015-05-15 DIAGNOSIS — Z85038 Personal history of other malignant neoplasm of large intestine: Secondary | ICD-10-CM | POA: Diagnosis not present

## 2015-05-15 DIAGNOSIS — K635 Polyp of colon: Secondary | ICD-10-CM | POA: Diagnosis not present

## 2015-05-15 DIAGNOSIS — D126 Benign neoplasm of colon, unspecified: Secondary | ICD-10-CM | POA: Diagnosis not present

## 2015-05-15 DIAGNOSIS — K5730 Diverticulosis of large intestine without perforation or abscess without bleeding: Secondary | ICD-10-CM | POA: Diagnosis not present

## 2015-05-15 DIAGNOSIS — K621 Rectal polyp: Secondary | ICD-10-CM | POA: Diagnosis not present

## 2015-06-11 DIAGNOSIS — M130 Polyarthritis, unspecified: Secondary | ICD-10-CM | POA: Diagnosis not present

## 2015-06-11 DIAGNOSIS — I10 Essential (primary) hypertension: Secondary | ICD-10-CM | POA: Diagnosis not present

## 2015-06-11 DIAGNOSIS — R7309 Other abnormal glucose: Secondary | ICD-10-CM | POA: Diagnosis not present

## 2015-06-25 DIAGNOSIS — J399 Disease of upper respiratory tract, unspecified: Secondary | ICD-10-CM | POA: Diagnosis not present

## 2015-07-17 DIAGNOSIS — H2513 Age-related nuclear cataract, bilateral: Secondary | ICD-10-CM | POA: Diagnosis not present

## 2015-08-07 DIAGNOSIS — H2513 Age-related nuclear cataract, bilateral: Secondary | ICD-10-CM | POA: Diagnosis not present

## 2015-09-04 DIAGNOSIS — H2512 Age-related nuclear cataract, left eye: Secondary | ICD-10-CM | POA: Diagnosis not present

## 2015-09-04 DIAGNOSIS — H269 Unspecified cataract: Secondary | ICD-10-CM | POA: Diagnosis not present

## 2015-09-05 DIAGNOSIS — H2511 Age-related nuclear cataract, right eye: Secondary | ICD-10-CM | POA: Diagnosis not present

## 2015-09-10 DIAGNOSIS — E789 Disorder of lipoprotein metabolism, unspecified: Secondary | ICD-10-CM | POA: Diagnosis not present

## 2015-09-10 DIAGNOSIS — E785 Hyperlipidemia, unspecified: Secondary | ICD-10-CM | POA: Diagnosis not present

## 2015-09-10 DIAGNOSIS — E118 Type 2 diabetes mellitus with unspecified complications: Secondary | ICD-10-CM | POA: Diagnosis not present

## 2015-09-10 DIAGNOSIS — M130 Polyarthritis, unspecified: Secondary | ICD-10-CM | POA: Diagnosis not present

## 2015-09-10 DIAGNOSIS — I10 Essential (primary) hypertension: Secondary | ICD-10-CM | POA: Diagnosis not present

## 2015-09-10 DIAGNOSIS — N189 Chronic kidney disease, unspecified: Secondary | ICD-10-CM | POA: Diagnosis not present

## 2015-09-18 DIAGNOSIS — H269 Unspecified cataract: Secondary | ICD-10-CM | POA: Diagnosis not present

## 2015-09-18 DIAGNOSIS — Z961 Presence of intraocular lens: Secondary | ICD-10-CM | POA: Diagnosis not present

## 2015-09-18 DIAGNOSIS — H2511 Age-related nuclear cataract, right eye: Secondary | ICD-10-CM | POA: Diagnosis not present

## 2015-10-22 DIAGNOSIS — H04123 Dry eye syndrome of bilateral lacrimal glands: Secondary | ICD-10-CM | POA: Diagnosis not present

## 2015-11-01 DIAGNOSIS — M25511 Pain in right shoulder: Secondary | ICD-10-CM | POA: Diagnosis not present

## 2015-11-05 DIAGNOSIS — I10 Essential (primary) hypertension: Secondary | ICD-10-CM | POA: Diagnosis not present

## 2015-11-05 DIAGNOSIS — M1990 Unspecified osteoarthritis, unspecified site: Secondary | ICD-10-CM | POA: Diagnosis not present

## 2015-11-05 DIAGNOSIS — E785 Hyperlipidemia, unspecified: Secondary | ICD-10-CM | POA: Diagnosis not present

## 2015-11-22 DIAGNOSIS — H018 Other specified inflammations of eyelid: Secondary | ICD-10-CM | POA: Diagnosis not present

## 2015-11-22 DIAGNOSIS — H04129 Dry eye syndrome of unspecified lacrimal gland: Secondary | ICD-10-CM | POA: Diagnosis not present

## 2015-11-23 DIAGNOSIS — M25519 Pain in unspecified shoulder: Secondary | ICD-10-CM | POA: Diagnosis not present

## 2015-11-25 DIAGNOSIS — M1990 Unspecified osteoarthritis, unspecified site: Secondary | ICD-10-CM | POA: Diagnosis not present

## 2015-11-25 DIAGNOSIS — R634 Abnormal weight loss: Secondary | ICD-10-CM | POA: Diagnosis not present

## 2015-11-25 DIAGNOSIS — I10 Essential (primary) hypertension: Secondary | ICD-10-CM | POA: Diagnosis not present

## 2015-11-25 DIAGNOSIS — E785 Hyperlipidemia, unspecified: Secondary | ICD-10-CM | POA: Diagnosis not present

## 2015-11-29 DIAGNOSIS — H018 Other specified inflammations of eyelid: Secondary | ICD-10-CM | POA: Diagnosis not present

## 2015-12-02 DIAGNOSIS — E785 Hyperlipidemia, unspecified: Secondary | ICD-10-CM | POA: Diagnosis not present

## 2015-12-02 DIAGNOSIS — M79601 Pain in right arm: Secondary | ICD-10-CM | POA: Diagnosis not present

## 2015-12-02 DIAGNOSIS — I10 Essential (primary) hypertension: Secondary | ICD-10-CM | POA: Diagnosis not present

## 2015-12-08 DIAGNOSIS — R58 Hemorrhage, not elsewhere classified: Secondary | ICD-10-CM | POA: Diagnosis not present

## 2015-12-08 DIAGNOSIS — R040 Epistaxis: Secondary | ICD-10-CM | POA: Diagnosis not present

## 2015-12-08 DIAGNOSIS — R0602 Shortness of breath: Secondary | ICD-10-CM | POA: Diagnosis not present

## 2015-12-11 DIAGNOSIS — R040 Epistaxis: Secondary | ICD-10-CM | POA: Diagnosis not present

## 2015-12-23 DIAGNOSIS — R200 Anesthesia of skin: Secondary | ICD-10-CM | POA: Diagnosis not present

## 2015-12-23 DIAGNOSIS — M79601 Pain in right arm: Secondary | ICD-10-CM | POA: Diagnosis not present

## 2015-12-30 DIAGNOSIS — R944 Abnormal results of kidney function studies: Secondary | ICD-10-CM | POA: Diagnosis not present

## 2015-12-30 DIAGNOSIS — Z1239 Encounter for other screening for malignant neoplasm of breast: Secondary | ICD-10-CM | POA: Diagnosis not present

## 2015-12-30 DIAGNOSIS — Z6823 Body mass index (BMI) 23.0-23.9, adult: Secondary | ICD-10-CM | POA: Diagnosis not present

## 2015-12-30 DIAGNOSIS — M25511 Pain in right shoulder: Secondary | ICD-10-CM | POA: Diagnosis not present

## 2015-12-31 DIAGNOSIS — R040 Epistaxis: Secondary | ICD-10-CM | POA: Diagnosis not present

## 2016-01-03 DIAGNOSIS — M25511 Pain in right shoulder: Secondary | ICD-10-CM | POA: Diagnosis not present

## 2016-01-03 DIAGNOSIS — G8929 Other chronic pain: Secondary | ICD-10-CM | POA: Diagnosis not present

## 2016-01-21 ENCOUNTER — Other Ambulatory Visit: Payer: Self-pay

## 2016-01-27 DIAGNOSIS — M25511 Pain in right shoulder: Secondary | ICD-10-CM | POA: Diagnosis not present

## 2016-01-27 DIAGNOSIS — M7551 Bursitis of right shoulder: Secondary | ICD-10-CM | POA: Diagnosis not present

## 2016-01-27 DIAGNOSIS — G8929 Other chronic pain: Secondary | ICD-10-CM | POA: Diagnosis not present

## 2016-01-30 DIAGNOSIS — Z1231 Encounter for screening mammogram for malignant neoplasm of breast: Secondary | ICD-10-CM | POA: Diagnosis not present

## 2016-02-06 DIAGNOSIS — Z23 Encounter for immunization: Secondary | ICD-10-CM | POA: Diagnosis not present

## 2016-02-07 DIAGNOSIS — H04129 Dry eye syndrome of unspecified lacrimal gland: Secondary | ICD-10-CM | POA: Diagnosis not present

## 2016-02-20 DIAGNOSIS — J029 Acute pharyngitis, unspecified: Secondary | ICD-10-CM | POA: Diagnosis not present

## 2016-02-20 DIAGNOSIS — Z79899 Other long term (current) drug therapy: Secondary | ICD-10-CM | POA: Diagnosis not present

## 2016-02-20 DIAGNOSIS — I499 Cardiac arrhythmia, unspecified: Secondary | ICD-10-CM | POA: Diagnosis not present

## 2016-02-20 DIAGNOSIS — R0600 Dyspnea, unspecified: Secondary | ICD-10-CM | POA: Diagnosis not present

## 2016-02-20 DIAGNOSIS — R251 Tremor, unspecified: Secondary | ICD-10-CM | POA: Diagnosis not present

## 2016-02-20 DIAGNOSIS — R739 Hyperglycemia, unspecified: Secondary | ICD-10-CM | POA: Diagnosis not present

## 2016-02-20 DIAGNOSIS — E538 Deficiency of other specified B group vitamins: Secondary | ICD-10-CM | POA: Diagnosis not present

## 2016-02-20 DIAGNOSIS — D649 Anemia, unspecified: Secondary | ICD-10-CM | POA: Diagnosis not present

## 2016-02-24 DIAGNOSIS — Z23 Encounter for immunization: Secondary | ICD-10-CM | POA: Diagnosis not present

## 2016-02-28 DIAGNOSIS — H04129 Dry eye syndrome of unspecified lacrimal gland: Secondary | ICD-10-CM | POA: Diagnosis not present

## 2016-03-10 DIAGNOSIS — R42 Dizziness and giddiness: Secondary | ICD-10-CM | POA: Diagnosis not present

## 2016-03-10 DIAGNOSIS — E782 Mixed hyperlipidemia: Secondary | ICD-10-CM | POA: Diagnosis not present

## 2016-03-10 DIAGNOSIS — I10 Essential (primary) hypertension: Secondary | ICD-10-CM | POA: Diagnosis not present

## 2016-03-10 DIAGNOSIS — R55 Syncope and collapse: Secondary | ICD-10-CM | POA: Diagnosis not present

## 2016-03-13 DIAGNOSIS — M25511 Pain in right shoulder: Secondary | ICD-10-CM | POA: Diagnosis not present

## 2016-03-16 DIAGNOSIS — G8929 Other chronic pain: Secondary | ICD-10-CM | POA: Diagnosis not present

## 2016-03-16 DIAGNOSIS — M25511 Pain in right shoulder: Secondary | ICD-10-CM | POA: Diagnosis not present

## 2016-03-23 DIAGNOSIS — I10 Essential (primary) hypertension: Secondary | ICD-10-CM | POA: Diagnosis not present

## 2016-03-23 DIAGNOSIS — I351 Nonrheumatic aortic (valve) insufficiency: Secondary | ICD-10-CM | POA: Diagnosis not present

## 2016-03-23 DIAGNOSIS — I340 Nonrheumatic mitral (valve) insufficiency: Secondary | ICD-10-CM | POA: Diagnosis not present

## 2016-03-24 DIAGNOSIS — R42 Dizziness and giddiness: Secondary | ICD-10-CM | POA: Diagnosis not present

## 2016-03-24 DIAGNOSIS — R55 Syncope and collapse: Secondary | ICD-10-CM | POA: Diagnosis not present

## 2016-03-25 DIAGNOSIS — M25511 Pain in right shoulder: Secondary | ICD-10-CM | POA: Diagnosis not present

## 2016-04-01 DIAGNOSIS — M25511 Pain in right shoulder: Secondary | ICD-10-CM | POA: Diagnosis not present

## 2016-04-01 DIAGNOSIS — G8929 Other chronic pain: Secondary | ICD-10-CM | POA: Diagnosis not present

## 2016-04-01 DIAGNOSIS — S46211D Strain of muscle, fascia and tendon of other parts of biceps, right arm, subsequent encounter: Secondary | ICD-10-CM | POA: Diagnosis not present

## 2016-04-01 DIAGNOSIS — M75111 Incomplete rotator cuff tear or rupture of right shoulder, not specified as traumatic: Secondary | ICD-10-CM | POA: Diagnosis not present

## 2016-04-07 DIAGNOSIS — N183 Chronic kidney disease, stage 3 (moderate): Secondary | ICD-10-CM | POA: Diagnosis not present

## 2016-04-07 DIAGNOSIS — M75111 Incomplete rotator cuff tear or rupture of right shoulder, not specified as traumatic: Secondary | ICD-10-CM | POA: Diagnosis not present

## 2016-04-07 DIAGNOSIS — I10 Essential (primary) hypertension: Secondary | ICD-10-CM | POA: Diagnosis not present

## 2016-04-07 DIAGNOSIS — S46211D Strain of muscle, fascia and tendon of other parts of biceps, right arm, subsequent encounter: Secondary | ICD-10-CM | POA: Diagnosis not present

## 2016-04-14 DIAGNOSIS — N183 Chronic kidney disease, stage 3 (moderate): Secondary | ICD-10-CM | POA: Diagnosis not present

## 2016-04-21 ENCOUNTER — Ambulatory Visit: Payer: Medicare Other | Admitting: Oncology

## 2016-04-21 ENCOUNTER — Other Ambulatory Visit: Payer: Medicare Other

## 2016-04-21 DIAGNOSIS — Z85038 Personal history of other malignant neoplasm of large intestine: Secondary | ICD-10-CM | POA: Diagnosis not present

## 2016-04-21 DIAGNOSIS — M75101 Unspecified rotator cuff tear or rupture of right shoulder, not specified as traumatic: Secondary | ICD-10-CM | POA: Diagnosis not present

## 2016-04-21 DIAGNOSIS — S46011A Strain of muscle(s) and tendon(s) of the rotator cuff of right shoulder, initial encounter: Secondary | ICD-10-CM | POA: Diagnosis not present

## 2016-04-21 DIAGNOSIS — E785 Hyperlipidemia, unspecified: Secondary | ICD-10-CM | POA: Diagnosis not present

## 2016-04-21 DIAGNOSIS — I10 Essential (primary) hypertension: Secondary | ICD-10-CM | POA: Diagnosis not present

## 2016-04-21 DIAGNOSIS — S46211D Strain of muscle, fascia and tendon of other parts of biceps, right arm, subsequent encounter: Secondary | ICD-10-CM | POA: Diagnosis not present

## 2016-04-21 DIAGNOSIS — G8918 Other acute postprocedural pain: Secondary | ICD-10-CM | POA: Diagnosis not present

## 2016-04-21 DIAGNOSIS — F329 Major depressive disorder, single episode, unspecified: Secondary | ICD-10-CM | POA: Diagnosis not present

## 2016-04-21 DIAGNOSIS — M75111 Incomplete rotator cuff tear or rupture of right shoulder, not specified as traumatic: Secondary | ICD-10-CM | POA: Diagnosis not present

## 2016-04-21 DIAGNOSIS — M7581 Other shoulder lesions, right shoulder: Secondary | ICD-10-CM | POA: Diagnosis not present

## 2016-04-21 DIAGNOSIS — F419 Anxiety disorder, unspecified: Secondary | ICD-10-CM | POA: Diagnosis not present

## 2016-04-21 DIAGNOSIS — M1990 Unspecified osteoarthritis, unspecified site: Secondary | ICD-10-CM | POA: Diagnosis not present

## 2016-04-21 DIAGNOSIS — M7551 Bursitis of right shoulder: Secondary | ICD-10-CM | POA: Diagnosis not present

## 2016-04-28 DIAGNOSIS — I10 Essential (primary) hypertension: Secondary | ICD-10-CM | POA: Diagnosis not present

## 2016-04-28 DIAGNOSIS — Z4789 Encounter for other orthopedic aftercare: Secondary | ICD-10-CM | POA: Diagnosis not present

## 2016-04-29 DIAGNOSIS — I10 Essential (primary) hypertension: Secondary | ICD-10-CM | POA: Diagnosis not present

## 2016-04-29 DIAGNOSIS — Z4789 Encounter for other orthopedic aftercare: Secondary | ICD-10-CM | POA: Diagnosis not present

## 2016-04-30 DIAGNOSIS — I10 Essential (primary) hypertension: Secondary | ICD-10-CM | POA: Diagnosis not present

## 2016-04-30 DIAGNOSIS — Z4789 Encounter for other orthopedic aftercare: Secondary | ICD-10-CM | POA: Diagnosis not present

## 2016-05-04 DIAGNOSIS — I10 Essential (primary) hypertension: Secondary | ICD-10-CM | POA: Diagnosis not present

## 2016-05-04 DIAGNOSIS — Z4789 Encounter for other orthopedic aftercare: Secondary | ICD-10-CM | POA: Diagnosis not present

## 2016-05-06 DIAGNOSIS — Z4789 Encounter for other orthopedic aftercare: Secondary | ICD-10-CM | POA: Diagnosis not present

## 2016-05-06 DIAGNOSIS — I10 Essential (primary) hypertension: Secondary | ICD-10-CM | POA: Diagnosis not present

## 2016-05-07 DIAGNOSIS — I10 Essential (primary) hypertension: Secondary | ICD-10-CM | POA: Diagnosis not present

## 2016-05-07 DIAGNOSIS — Z4789 Encounter for other orthopedic aftercare: Secondary | ICD-10-CM | POA: Diagnosis not present

## 2016-05-12 DIAGNOSIS — Z4789 Encounter for other orthopedic aftercare: Secondary | ICD-10-CM | POA: Diagnosis not present

## 2016-05-12 DIAGNOSIS — I10 Essential (primary) hypertension: Secondary | ICD-10-CM | POA: Diagnosis not present

## 2016-05-13 DIAGNOSIS — Z4789 Encounter for other orthopedic aftercare: Secondary | ICD-10-CM | POA: Diagnosis not present

## 2016-05-13 DIAGNOSIS — I10 Essential (primary) hypertension: Secondary | ICD-10-CM | POA: Diagnosis not present

## 2016-05-15 DIAGNOSIS — I10 Essential (primary) hypertension: Secondary | ICD-10-CM | POA: Diagnosis not present

## 2016-05-15 DIAGNOSIS — Z4789 Encounter for other orthopedic aftercare: Secondary | ICD-10-CM | POA: Diagnosis not present

## 2016-05-20 DIAGNOSIS — I10 Essential (primary) hypertension: Secondary | ICD-10-CM | POA: Diagnosis not present

## 2016-05-20 DIAGNOSIS — Z4789 Encounter for other orthopedic aftercare: Secondary | ICD-10-CM | POA: Diagnosis not present

## 2016-05-21 DIAGNOSIS — Z4789 Encounter for other orthopedic aftercare: Secondary | ICD-10-CM | POA: Diagnosis not present

## 2016-05-21 DIAGNOSIS — I10 Essential (primary) hypertension: Secondary | ICD-10-CM | POA: Diagnosis not present

## 2016-05-22 DIAGNOSIS — I10 Essential (primary) hypertension: Secondary | ICD-10-CM | POA: Diagnosis not present

## 2016-05-22 DIAGNOSIS — Z4789 Encounter for other orthopedic aftercare: Secondary | ICD-10-CM | POA: Diagnosis not present

## 2016-05-25 DIAGNOSIS — I10 Essential (primary) hypertension: Secondary | ICD-10-CM | POA: Diagnosis not present

## 2016-05-25 DIAGNOSIS — Z4789 Encounter for other orthopedic aftercare: Secondary | ICD-10-CM | POA: Diagnosis not present

## 2016-05-27 DIAGNOSIS — I10 Essential (primary) hypertension: Secondary | ICD-10-CM | POA: Diagnosis not present

## 2016-05-27 DIAGNOSIS — Z4789 Encounter for other orthopedic aftercare: Secondary | ICD-10-CM | POA: Diagnosis not present

## 2016-06-05 DIAGNOSIS — I10 Essential (primary) hypertension: Secondary | ICD-10-CM | POA: Diagnosis not present

## 2016-06-05 DIAGNOSIS — Z4789 Encounter for other orthopedic aftercare: Secondary | ICD-10-CM | POA: Diagnosis not present

## 2016-06-12 DIAGNOSIS — H1045 Other chronic allergic conjunctivitis: Secondary | ICD-10-CM | POA: Diagnosis not present

## 2016-06-12 DIAGNOSIS — H018 Other specified inflammations of eyelid: Secondary | ICD-10-CM | POA: Diagnosis not present

## 2016-06-26 DIAGNOSIS — H1045 Other chronic allergic conjunctivitis: Secondary | ICD-10-CM | POA: Diagnosis not present

## 2016-06-26 DIAGNOSIS — H018 Other specified inflammations of eyelid: Secondary | ICD-10-CM | POA: Diagnosis not present

## 2016-06-30 DIAGNOSIS — R42 Dizziness and giddiness: Secondary | ICD-10-CM | POA: Diagnosis not present

## 2016-06-30 DIAGNOSIS — I10 Essential (primary) hypertension: Secondary | ICD-10-CM | POA: Diagnosis not present

## 2016-06-30 DIAGNOSIS — E782 Mixed hyperlipidemia: Secondary | ICD-10-CM | POA: Diagnosis not present

## 2016-08-06 DIAGNOSIS — H8303 Labyrinthitis, bilateral: Secondary | ICD-10-CM | POA: Diagnosis not present

## 2016-08-06 DIAGNOSIS — J069 Acute upper respiratory infection, unspecified: Secondary | ICD-10-CM | POA: Diagnosis not present

## 2016-12-01 DIAGNOSIS — J309 Allergic rhinitis, unspecified: Secondary | ICD-10-CM | POA: Diagnosis not present

## 2016-12-01 DIAGNOSIS — J029 Acute pharyngitis, unspecified: Secondary | ICD-10-CM | POA: Diagnosis not present

## 2016-12-01 DIAGNOSIS — J069 Acute upper respiratory infection, unspecified: Secondary | ICD-10-CM | POA: Diagnosis not present

## 2017-01-04 ENCOUNTER — Telehealth: Payer: Self-pay | Admitting: *Deleted

## 2017-01-04 DIAGNOSIS — E119 Type 2 diabetes mellitus without complications: Secondary | ICD-10-CM | POA: Diagnosis not present

## 2017-01-04 DIAGNOSIS — Z131 Encounter for screening for diabetes mellitus: Secondary | ICD-10-CM | POA: Diagnosis not present

## 2017-01-04 DIAGNOSIS — Z6822 Body mass index (BMI) 22.0-22.9, adult: Secondary | ICD-10-CM | POA: Diagnosis not present

## 2017-01-04 DIAGNOSIS — R5383 Other fatigue: Secondary | ICD-10-CM | POA: Diagnosis not present

## 2017-01-04 NOTE — Telephone Encounter (Signed)
Call from pt reporting she has moved to [place]. Requested office contact information for her new PCP. Info provided.

## 2017-01-12 DIAGNOSIS — R42 Dizziness and giddiness: Secondary | ICD-10-CM | POA: Diagnosis not present

## 2017-01-12 DIAGNOSIS — M545 Low back pain: Secondary | ICD-10-CM | POA: Diagnosis not present

## 2017-01-22 DIAGNOSIS — H04123 Dry eye syndrome of bilateral lacrimal glands: Secondary | ICD-10-CM | POA: Diagnosis not present

## 2017-01-22 DIAGNOSIS — H52209 Unspecified astigmatism, unspecified eye: Secondary | ICD-10-CM | POA: Diagnosis not present

## 2017-01-25 DIAGNOSIS — I10 Essential (primary) hypertension: Secondary | ICD-10-CM | POA: Diagnosis not present

## 2017-01-25 DIAGNOSIS — N183 Chronic kidney disease, stage 3 (moderate): Secondary | ICD-10-CM | POA: Diagnosis not present

## 2017-01-25 DIAGNOSIS — Z23 Encounter for immunization: Secondary | ICD-10-CM | POA: Diagnosis not present

## 2017-01-25 DIAGNOSIS — E785 Hyperlipidemia, unspecified: Secondary | ICD-10-CM | POA: Diagnosis not present

## 2017-02-08 DIAGNOSIS — M5382 Other specified dorsopathies, cervical region: Secondary | ICD-10-CM | POA: Diagnosis not present

## 2017-02-19 DIAGNOSIS — J3489 Other specified disorders of nose and nasal sinuses: Secondary | ICD-10-CM | POA: Diagnosis not present

## 2017-02-19 DIAGNOSIS — R42 Dizziness and giddiness: Secondary | ICD-10-CM | POA: Diagnosis not present

## 2017-02-19 DIAGNOSIS — M545 Low back pain: Secondary | ICD-10-CM | POA: Diagnosis not present

## 2017-02-22 DIAGNOSIS — H8303 Labyrinthitis, bilateral: Secondary | ICD-10-CM | POA: Diagnosis not present

## 2017-02-22 DIAGNOSIS — R490 Dysphonia: Secondary | ICD-10-CM | POA: Diagnosis not present

## 2017-02-22 DIAGNOSIS — R42 Dizziness and giddiness: Secondary | ICD-10-CM | POA: Diagnosis not present

## 2017-02-22 DIAGNOSIS — Z6822 Body mass index (BMI) 22.0-22.9, adult: Secondary | ICD-10-CM | POA: Diagnosis not present

## 2017-03-17 DIAGNOSIS — I10 Essential (primary) hypertension: Secondary | ICD-10-CM | POA: Diagnosis not present

## 2017-03-17 DIAGNOSIS — R42 Dizziness and giddiness: Secondary | ICD-10-CM | POA: Diagnosis not present

## 2017-03-17 DIAGNOSIS — E785 Hyperlipidemia, unspecified: Secondary | ICD-10-CM | POA: Diagnosis not present

## 2017-03-17 DIAGNOSIS — R9431 Abnormal electrocardiogram [ECG] [EKG]: Secondary | ICD-10-CM | POA: Diagnosis not present

## 2017-04-06 DIAGNOSIS — I10 Essential (primary) hypertension: Secondary | ICD-10-CM | POA: Diagnosis not present

## 2017-04-06 DIAGNOSIS — Z6823 Body mass index (BMI) 23.0-23.9, adult: Secondary | ICD-10-CM | POA: Diagnosis not present

## 2017-05-19 DIAGNOSIS — J040 Acute laryngitis: Secondary | ICD-10-CM | POA: Diagnosis not present

## 2017-05-25 DIAGNOSIS — Z6822 Body mass index (BMI) 22.0-22.9, adult: Secondary | ICD-10-CM | POA: Diagnosis not present

## 2017-05-25 DIAGNOSIS — R531 Weakness: Secondary | ICD-10-CM | POA: Diagnosis not present

## 2017-05-25 DIAGNOSIS — N390 Urinary tract infection, site not specified: Secondary | ICD-10-CM | POA: Diagnosis not present

## 2017-05-25 DIAGNOSIS — R42 Dizziness and giddiness: Secondary | ICD-10-CM | POA: Diagnosis not present

## 2017-06-01 DIAGNOSIS — N390 Urinary tract infection, site not specified: Secondary | ICD-10-CM | POA: Diagnosis not present

## 2017-06-28 DIAGNOSIS — N390 Urinary tract infection, site not specified: Secondary | ICD-10-CM | POA: Diagnosis not present

## 2017-06-28 DIAGNOSIS — R7303 Prediabetes: Secondary | ICD-10-CM | POA: Diagnosis not present

## 2017-07-14 DIAGNOSIS — Z6822 Body mass index (BMI) 22.0-22.9, adult: Secondary | ICD-10-CM | POA: Diagnosis not present

## 2017-07-14 DIAGNOSIS — R42 Dizziness and giddiness: Secondary | ICD-10-CM | POA: Diagnosis not present

## 2017-07-14 DIAGNOSIS — I10 Essential (primary) hypertension: Secondary | ICD-10-CM | POA: Diagnosis not present

## 2017-07-14 DIAGNOSIS — R531 Weakness: Secondary | ICD-10-CM | POA: Diagnosis not present

## 2017-07-29 DIAGNOSIS — I10 Essential (primary) hypertension: Secondary | ICD-10-CM | POA: Diagnosis not present

## 2017-07-29 DIAGNOSIS — E785 Hyperlipidemia, unspecified: Secondary | ICD-10-CM | POA: Diagnosis not present

## 2017-07-29 DIAGNOSIS — N183 Chronic kidney disease, stage 3 (moderate): Secondary | ICD-10-CM | POA: Diagnosis not present

## 2017-08-21 DIAGNOSIS — M545 Low back pain: Secondary | ICD-10-CM | POA: Diagnosis not present

## 2017-09-17 DIAGNOSIS — H04123 Dry eye syndrome of bilateral lacrimal glands: Secondary | ICD-10-CM | POA: Diagnosis not present

## 2017-11-29 DIAGNOSIS — Z6821 Body mass index (BMI) 21.0-21.9, adult: Secondary | ICD-10-CM | POA: Diagnosis not present

## 2017-11-29 DIAGNOSIS — R42 Dizziness and giddiness: Secondary | ICD-10-CM | POA: Diagnosis not present

## 2017-11-29 DIAGNOSIS — I10 Essential (primary) hypertension: Secondary | ICD-10-CM | POA: Diagnosis not present

## 2017-12-17 ENCOUNTER — Other Ambulatory Visit: Payer: Self-pay

## 2017-12-27 DIAGNOSIS — R42 Dizziness and giddiness: Secondary | ICD-10-CM | POA: Diagnosis not present

## 2017-12-27 DIAGNOSIS — Z6821 Body mass index (BMI) 21.0-21.9, adult: Secondary | ICD-10-CM | POA: Diagnosis not present

## 2017-12-27 DIAGNOSIS — I10 Essential (primary) hypertension: Secondary | ICD-10-CM | POA: Diagnosis not present

## 2018-01-05 DIAGNOSIS — R270 Ataxia, unspecified: Secondary | ICD-10-CM | POA: Diagnosis not present

## 2018-01-05 DIAGNOSIS — R42 Dizziness and giddiness: Secondary | ICD-10-CM | POA: Diagnosis not present

## 2018-01-21 DIAGNOSIS — G8929 Other chronic pain: Secondary | ICD-10-CM | POA: Diagnosis not present

## 2018-01-21 DIAGNOSIS — M545 Low back pain: Secondary | ICD-10-CM | POA: Diagnosis not present

## 2018-01-28 DIAGNOSIS — H52209 Unspecified astigmatism, unspecified eye: Secondary | ICD-10-CM | POA: Diagnosis not present

## 2018-01-28 DIAGNOSIS — H04123 Dry eye syndrome of bilateral lacrimal glands: Secondary | ICD-10-CM | POA: Diagnosis not present

## 2018-01-31 DIAGNOSIS — M9904 Segmental and somatic dysfunction of sacral region: Secondary | ICD-10-CM | POA: Diagnosis not present

## 2018-01-31 DIAGNOSIS — M461 Sacroiliitis, not elsewhere classified: Secondary | ICD-10-CM | POA: Diagnosis not present

## 2018-01-31 DIAGNOSIS — M9903 Segmental and somatic dysfunction of lumbar region: Secondary | ICD-10-CM | POA: Diagnosis not present

## 2018-01-31 DIAGNOSIS — M5137 Other intervertebral disc degeneration, lumbosacral region: Secondary | ICD-10-CM | POA: Diagnosis not present

## 2018-01-31 DIAGNOSIS — M5136 Other intervertebral disc degeneration, lumbar region: Secondary | ICD-10-CM | POA: Diagnosis not present

## 2018-02-01 DIAGNOSIS — M461 Sacroiliitis, not elsewhere classified: Secondary | ICD-10-CM | POA: Diagnosis not present

## 2018-02-01 DIAGNOSIS — M9904 Segmental and somatic dysfunction of sacral region: Secondary | ICD-10-CM | POA: Diagnosis not present

## 2018-02-01 DIAGNOSIS — M5137 Other intervertebral disc degeneration, lumbosacral region: Secondary | ICD-10-CM | POA: Diagnosis not present

## 2018-02-01 DIAGNOSIS — M5136 Other intervertebral disc degeneration, lumbar region: Secondary | ICD-10-CM | POA: Diagnosis not present

## 2018-02-01 DIAGNOSIS — M9903 Segmental and somatic dysfunction of lumbar region: Secondary | ICD-10-CM | POA: Diagnosis not present

## 2018-02-02 DIAGNOSIS — M461 Sacroiliitis, not elsewhere classified: Secondary | ICD-10-CM | POA: Diagnosis not present

## 2018-02-02 DIAGNOSIS — M5136 Other intervertebral disc degeneration, lumbar region: Secondary | ICD-10-CM | POA: Diagnosis not present

## 2018-02-02 DIAGNOSIS — M9903 Segmental and somatic dysfunction of lumbar region: Secondary | ICD-10-CM | POA: Diagnosis not present

## 2018-02-02 DIAGNOSIS — M5137 Other intervertebral disc degeneration, lumbosacral region: Secondary | ICD-10-CM | POA: Diagnosis not present

## 2018-02-02 DIAGNOSIS — M9904 Segmental and somatic dysfunction of sacral region: Secondary | ICD-10-CM | POA: Diagnosis not present

## 2018-02-07 DIAGNOSIS — M5136 Other intervertebral disc degeneration, lumbar region: Secondary | ICD-10-CM | POA: Diagnosis not present

## 2018-02-07 DIAGNOSIS — M9903 Segmental and somatic dysfunction of lumbar region: Secondary | ICD-10-CM | POA: Diagnosis not present

## 2018-02-07 DIAGNOSIS — M5137 Other intervertebral disc degeneration, lumbosacral region: Secondary | ICD-10-CM | POA: Diagnosis not present

## 2018-02-07 DIAGNOSIS — M9904 Segmental and somatic dysfunction of sacral region: Secondary | ICD-10-CM | POA: Diagnosis not present

## 2018-02-07 DIAGNOSIS — M461 Sacroiliitis, not elsewhere classified: Secondary | ICD-10-CM | POA: Diagnosis not present

## 2018-02-08 DIAGNOSIS — M9903 Segmental and somatic dysfunction of lumbar region: Secondary | ICD-10-CM | POA: Diagnosis not present

## 2018-02-08 DIAGNOSIS — M5137 Other intervertebral disc degeneration, lumbosacral region: Secondary | ICD-10-CM | POA: Diagnosis not present

## 2018-02-08 DIAGNOSIS — M461 Sacroiliitis, not elsewhere classified: Secondary | ICD-10-CM | POA: Diagnosis not present

## 2018-02-08 DIAGNOSIS — M9904 Segmental and somatic dysfunction of sacral region: Secondary | ICD-10-CM | POA: Diagnosis not present

## 2018-02-08 DIAGNOSIS — M5136 Other intervertebral disc degeneration, lumbar region: Secondary | ICD-10-CM | POA: Diagnosis not present

## 2018-02-10 DIAGNOSIS — Z23 Encounter for immunization: Secondary | ICD-10-CM | POA: Diagnosis not present

## 2018-02-14 DIAGNOSIS — M5136 Other intervertebral disc degeneration, lumbar region: Secondary | ICD-10-CM | POA: Diagnosis not present

## 2018-02-14 DIAGNOSIS — M9903 Segmental and somatic dysfunction of lumbar region: Secondary | ICD-10-CM | POA: Diagnosis not present

## 2018-02-14 DIAGNOSIS — M9904 Segmental and somatic dysfunction of sacral region: Secondary | ICD-10-CM | POA: Diagnosis not present

## 2018-02-14 DIAGNOSIS — R413 Other amnesia: Secondary | ICD-10-CM | POA: Diagnosis not present

## 2018-02-14 DIAGNOSIS — M5137 Other intervertebral disc degeneration, lumbosacral region: Secondary | ICD-10-CM | POA: Diagnosis not present

## 2018-02-14 DIAGNOSIS — R7303 Prediabetes: Secondary | ICD-10-CM | POA: Diagnosis not present

## 2018-02-14 DIAGNOSIS — Z6821 Body mass index (BMI) 21.0-21.9, adult: Secondary | ICD-10-CM | POA: Diagnosis not present

## 2018-02-14 DIAGNOSIS — R42 Dizziness and giddiness: Secondary | ICD-10-CM | POA: Diagnosis not present

## 2018-02-14 DIAGNOSIS — Z1389 Encounter for screening for other disorder: Secondary | ICD-10-CM | POA: Diagnosis not present

## 2018-02-14 DIAGNOSIS — H6123 Impacted cerumen, bilateral: Secondary | ICD-10-CM | POA: Diagnosis not present

## 2018-02-14 DIAGNOSIS — M461 Sacroiliitis, not elsewhere classified: Secondary | ICD-10-CM | POA: Diagnosis not present

## 2018-02-15 DIAGNOSIS — M9903 Segmental and somatic dysfunction of lumbar region: Secondary | ICD-10-CM | POA: Diagnosis not present

## 2018-02-15 DIAGNOSIS — M9904 Segmental and somatic dysfunction of sacral region: Secondary | ICD-10-CM | POA: Diagnosis not present

## 2018-02-15 DIAGNOSIS — M5137 Other intervertebral disc degeneration, lumbosacral region: Secondary | ICD-10-CM | POA: Diagnosis not present

## 2018-02-15 DIAGNOSIS — M5136 Other intervertebral disc degeneration, lumbar region: Secondary | ICD-10-CM | POA: Diagnosis not present

## 2018-02-15 DIAGNOSIS — M461 Sacroiliitis, not elsewhere classified: Secondary | ICD-10-CM | POA: Diagnosis not present

## 2018-02-16 DIAGNOSIS — M9903 Segmental and somatic dysfunction of lumbar region: Secondary | ICD-10-CM | POA: Diagnosis not present

## 2018-02-16 DIAGNOSIS — M5137 Other intervertebral disc degeneration, lumbosacral region: Secondary | ICD-10-CM | POA: Diagnosis not present

## 2018-02-16 DIAGNOSIS — M461 Sacroiliitis, not elsewhere classified: Secondary | ICD-10-CM | POA: Diagnosis not present

## 2018-02-16 DIAGNOSIS — M9904 Segmental and somatic dysfunction of sacral region: Secondary | ICD-10-CM | POA: Diagnosis not present

## 2018-02-16 DIAGNOSIS — M5136 Other intervertebral disc degeneration, lumbar region: Secondary | ICD-10-CM | POA: Diagnosis not present

## 2018-02-21 DIAGNOSIS — M9904 Segmental and somatic dysfunction of sacral region: Secondary | ICD-10-CM | POA: Diagnosis not present

## 2018-02-21 DIAGNOSIS — M5136 Other intervertebral disc degeneration, lumbar region: Secondary | ICD-10-CM | POA: Diagnosis not present

## 2018-02-21 DIAGNOSIS — M5137 Other intervertebral disc degeneration, lumbosacral region: Secondary | ICD-10-CM | POA: Diagnosis not present

## 2018-02-21 DIAGNOSIS — M461 Sacroiliitis, not elsewhere classified: Secondary | ICD-10-CM | POA: Diagnosis not present

## 2018-02-21 DIAGNOSIS — M9903 Segmental and somatic dysfunction of lumbar region: Secondary | ICD-10-CM | POA: Diagnosis not present

## 2018-02-22 DIAGNOSIS — M5137 Other intervertebral disc degeneration, lumbosacral region: Secondary | ICD-10-CM | POA: Diagnosis not present

## 2018-02-22 DIAGNOSIS — M461 Sacroiliitis, not elsewhere classified: Secondary | ICD-10-CM | POA: Diagnosis not present

## 2018-02-22 DIAGNOSIS — M9904 Segmental and somatic dysfunction of sacral region: Secondary | ICD-10-CM | POA: Diagnosis not present

## 2018-02-22 DIAGNOSIS — M9903 Segmental and somatic dysfunction of lumbar region: Secondary | ICD-10-CM | POA: Diagnosis not present

## 2018-02-22 DIAGNOSIS — M5136 Other intervertebral disc degeneration, lumbar region: Secondary | ICD-10-CM | POA: Diagnosis not present

## 2018-02-23 DIAGNOSIS — M5137 Other intervertebral disc degeneration, lumbosacral region: Secondary | ICD-10-CM | POA: Diagnosis not present

## 2018-02-23 DIAGNOSIS — M9904 Segmental and somatic dysfunction of sacral region: Secondary | ICD-10-CM | POA: Diagnosis not present

## 2018-02-23 DIAGNOSIS — M9903 Segmental and somatic dysfunction of lumbar region: Secondary | ICD-10-CM | POA: Diagnosis not present

## 2018-02-23 DIAGNOSIS — M461 Sacroiliitis, not elsewhere classified: Secondary | ICD-10-CM | POA: Diagnosis not present

## 2018-02-23 DIAGNOSIS — M5136 Other intervertebral disc degeneration, lumbar region: Secondary | ICD-10-CM | POA: Diagnosis not present

## 2018-02-28 DIAGNOSIS — M9904 Segmental and somatic dysfunction of sacral region: Secondary | ICD-10-CM | POA: Diagnosis not present

## 2018-02-28 DIAGNOSIS — M461 Sacroiliitis, not elsewhere classified: Secondary | ICD-10-CM | POA: Diagnosis not present

## 2018-02-28 DIAGNOSIS — M5137 Other intervertebral disc degeneration, lumbosacral region: Secondary | ICD-10-CM | POA: Diagnosis not present

## 2018-02-28 DIAGNOSIS — M5136 Other intervertebral disc degeneration, lumbar region: Secondary | ICD-10-CM | POA: Diagnosis not present

## 2018-02-28 DIAGNOSIS — M9903 Segmental and somatic dysfunction of lumbar region: Secondary | ICD-10-CM | POA: Diagnosis not present

## 2018-03-01 DIAGNOSIS — R413 Other amnesia: Secondary | ICD-10-CM | POA: Diagnosis not present

## 2018-03-01 DIAGNOSIS — M5136 Other intervertebral disc degeneration, lumbar region: Secondary | ICD-10-CM | POA: Diagnosis not present

## 2018-03-01 DIAGNOSIS — M9903 Segmental and somatic dysfunction of lumbar region: Secondary | ICD-10-CM | POA: Diagnosis not present

## 2018-03-01 DIAGNOSIS — M5137 Other intervertebral disc degeneration, lumbosacral region: Secondary | ICD-10-CM | POA: Diagnosis not present

## 2018-03-01 DIAGNOSIS — I10 Essential (primary) hypertension: Secondary | ICD-10-CM | POA: Diagnosis not present

## 2018-03-01 DIAGNOSIS — M461 Sacroiliitis, not elsewhere classified: Secondary | ICD-10-CM | POA: Diagnosis not present

## 2018-03-01 DIAGNOSIS — M9904 Segmental and somatic dysfunction of sacral region: Secondary | ICD-10-CM | POA: Diagnosis not present

## 2018-03-01 DIAGNOSIS — R42 Dizziness and giddiness: Secondary | ICD-10-CM | POA: Diagnosis not present

## 2018-03-07 DIAGNOSIS — I10 Essential (primary) hypertension: Secondary | ICD-10-CM | POA: Diagnosis not present

## 2018-03-09 DIAGNOSIS — R42 Dizziness and giddiness: Secondary | ICD-10-CM | POA: Diagnosis not present

## 2018-03-09 DIAGNOSIS — Z79899 Other long term (current) drug therapy: Secondary | ICD-10-CM | POA: Diagnosis not present

## 2018-03-09 DIAGNOSIS — I10 Essential (primary) hypertension: Secondary | ICD-10-CM | POA: Diagnosis not present

## 2018-03-09 DIAGNOSIS — Z85038 Personal history of other malignant neoplasm of large intestine: Secondary | ICD-10-CM | POA: Diagnosis not present

## 2018-05-12 DIAGNOSIS — R109 Unspecified abdominal pain: Secondary | ICD-10-CM | POA: Diagnosis not present

## 2018-05-12 DIAGNOSIS — M25561 Pain in right knee: Secondary | ICD-10-CM | POA: Diagnosis not present

## 2019-08-17 DEATH — deceased
